# Patient Record
Sex: Female | Born: 1990 | Race: Asian | Hispanic: No | Marital: Married | Smoking: Never smoker
Health system: Southern US, Community
[De-identification: ages and names within clinical notes are randomized; demographics above are authoritative.]

## PROBLEM LIST (undated history)

## (undated) ENCOUNTER — Inpatient Hospital Stay (HOSPITAL_COMMUNITY): Payer: Self-pay

## (undated) DIAGNOSIS — Z789 Other specified health status: Secondary | ICD-10-CM

## (undated) HISTORY — PX: NO PAST SURGERIES: SHX2092

---

## 2008-05-26 ENCOUNTER — Emergency Department (HOSPITAL_COMMUNITY): Admission: EM | Admit: 2008-05-26 | Discharge: 2008-05-27 | Payer: Self-pay | Admitting: Emergency Medicine

## 2009-05-20 ENCOUNTER — Inpatient Hospital Stay (HOSPITAL_COMMUNITY): Admission: AD | Admit: 2009-05-20 | Discharge: 2009-05-21 | Payer: Self-pay | Admitting: Obstetrics & Gynecology

## 2009-05-28 ENCOUNTER — Ambulatory Visit (HOSPITAL_COMMUNITY): Admission: RE | Admit: 2009-05-28 | Discharge: 2009-05-28 | Payer: Self-pay | Admitting: Obstetrics

## 2010-03-08 ENCOUNTER — Encounter: Payer: Self-pay | Admitting: Obstetrics

## 2010-05-06 LAB — URINALYSIS, ROUTINE W REFLEX MICROSCOPIC
Bilirubin Urine: NEGATIVE
Glucose, UA: NEGATIVE mg/dL
Hgb urine dipstick: NEGATIVE
Nitrite: NEGATIVE
Specific Gravity, Urine: 1.015 (ref 1.005–1.030)
pH: 7 (ref 5.0–8.0)

## 2010-09-18 LAB — ABO/RH: RH Type: POSITIVE

## 2010-09-18 LAB — GC/CHLAMYDIA PROBE AMP, GENITAL: Chlamydia: NEGATIVE

## 2010-09-18 LAB — RUBELLA ANTIBODY, IGM: Rubella: IMMUNE

## 2011-01-05 ENCOUNTER — Encounter (HOSPITAL_COMMUNITY): Payer: Self-pay | Admitting: *Deleted

## 2011-01-05 ENCOUNTER — Inpatient Hospital Stay (HOSPITAL_COMMUNITY)
Admission: AD | Admit: 2011-01-05 | Discharge: 2011-01-05 | Disposition: A | Discharge status: Home / Self Care | Payer: Medicaid Other | Source: Ambulatory Visit | Attending: Obstetrics | Admitting: Obstetrics

## 2011-01-05 DIAGNOSIS — N930 Postcoital and contact bleeding: Secondary | ICD-10-CM

## 2011-01-05 DIAGNOSIS — O4690 Antepartum hemorrhage, unspecified, unspecified trimester: Secondary | ICD-10-CM | POA: Insufficient documentation

## 2011-01-05 DIAGNOSIS — Z331 Pregnant state, incidental: Secondary | ICD-10-CM

## 2011-01-05 HISTORY — DX: Other specified health status: Z78.9

## 2011-01-05 LAB — URINE MICROSCOPIC-ADD ON

## 2011-01-05 LAB — URINALYSIS, ROUTINE W REFLEX MICROSCOPIC
Bilirubin Urine: NEGATIVE
Nitrite: NEGATIVE
Urobilinogen, UA: 0.2 mg/dL (ref 0.0–1.0)

## 2011-01-05 LAB — WET PREP, GENITAL: Trich, Wet Prep: NONE SEEN

## 2011-01-05 NOTE — ED Provider Notes (Signed)
Chief Complaint:  Vaginal Bleeding   Latoya Sanchez is  20 y.o. G4P0030.  No LMP recorded. Patient is pregnant..  [redacted]w[redacted]d  She presents complaining of Vaginal Bleeding . Onset is described as sudden and has been present for  4 hours. Reports mild cramping last night after intercourse. States cramping this morning with pink spotting with voiding today. + FM. Denies ctx, BRB, or LOF.  Obstetrical/Gynecological History: OB History    Grav Para Term Preterm Abortions TAB SAB Ect Mult Living   4    3 1 2          Past Medical History: Past Medical History  Diagnosis Date  . No pertinent past medical history     Past Surgical History: Past Surgical History  Procedure Date  . No past surgeries     Family History: No family history on file.  Social History: History  Substance Use Topics  . Smoking status: Never Smoker   . Smokeless tobacco: Not on file  . Alcohol Use: No    Allergies: No Known Allergies  Prescriptions prior to admission  Medication Sig Dispense Refill  . prenatal vitamin w/FE, FA (PRENATAL 1 + 1) 27-1 MG TABS Take 1 tablet by mouth daily.          Review of Systems - Negative except what has been reviewed in the HPI  Physical Exam   Blood pressure 118/67, pulse 117, temperature 98.2 F (36.8 C), temperature source Oral, resp. rate 20, height 5\' 2"  (1.575 m), weight 76.658 kg (169 lb), SpO2 98.00%.  General: General appearance - alert, well appearing, and in no distress, oriented to person, place, and time and normal appearing weight Mental status - alert, oriented to person, place, and time, normal mood, behavior, speech, dress, motor activity, and thought processes, affect appropriate to mood Abdomen - gravid, non-tender FHR: 150, category I, no ctx Focused Gynecological Exam: VULVA: normal appearing vulva with no masses, tenderness or lesions, VAGINA: normal appearing vagina with normal color and discharge, no lesions, CERVIX:  Closed/Stripling/thick  Labs: Recent Results (from the past 24 hour(s))  URINALYSIS, ROUTINE W REFLEX MICROSCOPIC   Collection Time   01/05/11  9:27 AM      Component Value Range   Color, Urine YELLOW  YELLOW    Appearance CLEAR  CLEAR    Specific Gravity, Urine 1.010  1.005 - 1.030    pH 6.0  5.0 - 8.0    Glucose, UA NEGATIVE  NEGATIVE (mg/dL)   Hgb urine dipstick SMALL (*) NEGATIVE    Bilirubin Urine NEGATIVE  NEGATIVE    Ketones, ur NEGATIVE  NEGATIVE (mg/dL)   Protein, ur NEGATIVE  NEGATIVE (mg/dL)   Urobilinogen, UA 0.2  0.0 - 1.0 (mg/dL)   Nitrite NEGATIVE  NEGATIVE    Leukocytes, UA NEGATIVE  NEGATIVE   URINE MICROSCOPIC-ADD ON   Collection Time   01/05/11  9:27 AM      Component Value Range   Squamous Epithelial / LPF RARE  RARE    WBC, UA 0-2  <3 (WBC/hpf)   RBC / HPF 3-6  <3 (RBC/hpf)  WET PREP, GENITAL   Collection Time   01/05/11  9:45 AM      Component Value Range   Yeast, Wet Prep NONE SEEN  NONE SEEN    Trich, Wet Prep NONE SEEN  NONE SEEN    Clue Cells, Wet Prep FEW (*) NONE SEEN    WBC, Wet Prep HPF POC FEW (*) NONE SEEN  MD Consult: d/w Dr. Gaynell Face  Assessment: 30w6 day Gestation Post-Coital Spotting  Plan: Discharge home Bleeding precautions FU with Dr. Gaynell Face as scheduled  Latoya Sanchez E. 01/05/2011,10:01 AM

## 2011-01-05 NOTE — Progress Notes (Signed)
Started spotting about ago.  Cramping woke her at 0500.  First episode of either, no hx of previa or low lying placenta

## 2011-03-07 ENCOUNTER — Encounter (HOSPITAL_COMMUNITY): Payer: Self-pay | Admitting: *Deleted

## 2011-03-07 ENCOUNTER — Inpatient Hospital Stay (HOSPITAL_COMMUNITY)
Admission: AD | Admit: 2011-03-07 | Discharge: 2011-03-12 | DRG: 765 | Disposition: A | Discharge status: Home / Self Care | Payer: Medicaid Other | Source: Ambulatory Visit | Attending: Obstetrics and Gynecology | Admitting: Obstetrics and Gynecology

## 2011-03-07 DIAGNOSIS — Z3400 Encounter for supervision of normal first pregnancy, unspecified trimester: Secondary | ICD-10-CM

## 2011-03-07 DIAGNOSIS — Z98891 History of uterine scar from previous surgery: Secondary | ICD-10-CM | POA: Diagnosis present

## 2011-03-07 DIAGNOSIS — O4100X0 Oligohydramnios, unspecified trimester, not applicable or unspecified: Principal | ICD-10-CM

## 2011-03-07 DIAGNOSIS — IMO0001 Reserved for inherently not codable concepts without codable children: Secondary | ICD-10-CM

## 2011-03-07 DIAGNOSIS — Z3490 Encounter for supervision of normal pregnancy, unspecified, unspecified trimester: Secondary | ICD-10-CM

## 2011-03-07 DIAGNOSIS — D649 Anemia, unspecified: Secondary | ICD-10-CM | POA: Diagnosis present

## 2011-03-07 DIAGNOSIS — O411090 Infection of amniotic sac and membranes, unspecified, unspecified trimester, not applicable or unspecified: Secondary | ICD-10-CM | POA: Diagnosis present

## 2011-03-07 DIAGNOSIS — O324XX0 Maternal care for high head at term, not applicable or unspecified: Secondary | ICD-10-CM | POA: Diagnosis present

## 2011-03-07 DIAGNOSIS — O9903 Anemia complicating the puerperium: Secondary | ICD-10-CM | POA: Diagnosis not present

## 2011-03-07 NOTE — ED Provider Notes (Signed)
History   21 yo G4P0030 at 67 4/7 weeks presented c/o pelvic pain, back pain, increased discharge today.  Reports +FM, denies bleeding.  Cervix has been 3 cm, 70% 2 weeks ago at office visit.  Pregnancy remarkable for: Transfer of care at 35 weeks (from Dr. Gaynell Face) TAB at 15 weeks 2 SABs, one at 14 weeks GBS negative 02/22/11  History of present pregnancy: Patient transferred care at 35 weeks from Dr. Elsie Stain office.  EDC of 03/10/11 was established by [place] 10/08/10.  Anatomy scan was done around 20 weeks, with normal findings per patient report.  Her prenatal course was essentially uncomplicated per her report.  Further signficant events were vaginal bleeding post-coital at 30 weeks. At her last evalution, she was 3 cm, 70% per [facility], CNM--this was 2 weeks ago.  No [place] since anatomy scan.  GBS and cultures negative 02/22/11.  Chief Complaint  Patient presents with  . Labor Eval     OB History    Grav Para Term Preterm Abortions TAB SAB Ect Mult Living   4    3 1 2    0    #1--2005 TAB at 15 weeks #2--2008 SAB at 6 weeks #3--2011 SAB at 14 weeks #4--Current  Past Medical History  Diagnosis Date  . No pertinent past medical history   Hx chlamydia in past per Dr. Elsie Stain records.  Past Surgical History  Procedure Date  . No past surgeries   TAB at 15 weeks 2005  No family history on file. Brother asthma.  MGF leukemia.  MGM thyroid disease.  History  Substance Use Topics  . Smoking status: Never Smoker   . Smokeless tobacco: Not on file  . Alcohol Use: No    Allergies: No Known Allergies  Labs: B+ Negative antibody screen RPR NR HIV negative HbSAG negative Rubella immune Glucola 117 Quad screen normal Cultures negative at NOB and 02/22/11 Hgb 11.6 at [facility] Pap WNL 7/12. GBS negative 02/22/11   Prescriptions prior to admission  Medication Sig Dispense Refill  . prenatal vitamin w/FE, FA (PRENATAL 1 + 1) 27-1 MG TABS Take 1 tablet by mouth daily.           ROS:  Cramping, back pain, increased discharge, +FM.  Physical Exam   Blood pressure 125/82, pulse 107, temperature 98.5 F (36.9 C), resp. rate 18, height 5\' 2"  (1.575 m), weight 182 lb (82.555 kg).  Patient in NAD Chest clear Heart RRR without murmur Abd gravid, NT, 41 week FH Pelvic--no obvious leaking noted.  Cervix very posterior, vtx -1/0 station, 3-4 cm, 80% per RN exam. Ext WNL FHR non-reactive, no decels, negative spontaneous CST UCs 4-5 min, mild.  ED Course  IUP at 39 4/7 weeks Early vs prodromal labor ? Leaking by report  Plan: Check amnisure Check BPP  Nigel Bridgeman, CNM, [place] 03/08/11 12:10am  Addendum: Amnisure negative. FHR without decels, but continues to be non-reactive, negative CST. Will check BPP and EFW, AFI due to size > dates.  Nigel Bridgeman, CNM, [place] 03/08/11 12:30am

## 2011-03-07 NOTE — Progress Notes (Signed)
Pt reports "a lot of pressure in my pelvic area, pressure in my lower back , and cramping in my lower abd". Denies bleeding or ROM. Denies problems with preg. G3P0

## 2011-03-08 ENCOUNTER — Inpatient Hospital Stay (HOSPITAL_COMMUNITY): Payer: Medicaid Other

## 2011-03-08 ENCOUNTER — Encounter (HOSPITAL_COMMUNITY): Payer: Self-pay | Admitting: *Deleted

## 2011-03-08 DIAGNOSIS — IMO0001 Reserved for inherently not codable concepts without codable children: Secondary | ICD-10-CM

## 2011-03-08 DIAGNOSIS — O4100X0 Oligohydramnios, unspecified trimester, not applicable or unspecified: Secondary | ICD-10-CM | POA: Diagnosis present

## 2011-03-08 DIAGNOSIS — Z3490 Encounter for supervision of normal pregnancy, unspecified, unspecified trimester: Secondary | ICD-10-CM

## 2011-03-08 LAB — LACTATE DEHYDROGENASE: LDH: 219 U/L (ref 94–250)

## 2011-03-08 LAB — COMPREHENSIVE METABOLIC PANEL
Albumin: 2.6 g/dL — ABNORMAL LOW (ref 3.5–5.2)
BUN: 10 mg/dL (ref 6–23)
Calcium: 9.3 mg/dL (ref 8.4–10.5)
Chloride: 104 mEq/L (ref 96–112)
Creatinine, Ser: 0.54 mg/dL (ref 0.50–1.10)
Total Bilirubin: 0.2 mg/dL — ABNORMAL LOW (ref 0.3–1.2)

## 2011-03-08 LAB — CBC
MCH: 23.6 pg — ABNORMAL LOW (ref 26.0–34.0)
MCHC: 33 g/dL (ref 30.0–36.0)
MCV: 71.5 fL — ABNORMAL LOW (ref 78.0–100.0)
Platelets: 283 10*3/uL (ref 150–400)
RBC: 4.32 MIL/uL (ref 3.87–5.11)
RDW: 15.1 % (ref 11.5–15.5)

## 2011-03-08 LAB — URIC ACID: Uric Acid, Serum: 6.2 mg/dL (ref 2.4–7.0)

## 2011-03-08 LAB — STREP B DNA PROBE: GBS: NEGATIVE

## 2011-03-08 MED ORDER — PHENYLEPHRINE 40 MCG/ML (10ML) SYRINGE FOR IV PUSH (FOR BLOOD PRESSURE SUPPORT)
80.0000 ug | PREFILLED_SYRINGE | INTRAVENOUS | Status: DC | PRN
  Filled 2011-03-08: qty 5

## 2011-03-08 MED ORDER — BUTORPHANOL TARTRATE 2 MG/ML IJ SOLN: 1.0000 mg | INTRAMUSCULAR | Status: DC | PRN

## 2011-03-08 MED ORDER — LACTATED RINGERS IV SOLN
INTRAVENOUS | Status: DC
  Administered 2011-03-08: 21:00:00 via INTRAVENOUS
  Administered 2011-03-08: 125 mL/h via INTRAVENOUS
  Administered 2011-03-08: 02:00:00 via INTRAVENOUS
  Administered 2011-03-08: 125 mL/h via INTRAVENOUS

## 2011-03-08 MED ORDER — FLEET ENEMA 7-19 GM/118ML RE ENEM: 1.0000 | ENEMA | RECTAL | Status: DC | PRN

## 2011-03-08 MED ORDER — FENTANYL 2.5 MCG/ML BUPIVACAINE 1/10 % EPIDURAL INFUSION (WH - ANES)
INTRAMUSCULAR | Status: DC | PRN
  Administered 2011-03-08: 12 mL/h via EPIDURAL

## 2011-03-08 MED ORDER — IBUPROFEN 600 MG PO TABS: 600.0000 mg | ORAL_TABLET | Freq: Four times a day (QID) | ORAL | Status: DC | PRN

## 2011-03-08 MED ORDER — PHENYLEPHRINE 40 MCG/ML (10ML) SYRINGE FOR IV PUSH (FOR BLOOD PRESSURE SUPPORT): 80.0000 ug | PREFILLED_SYRINGE | INTRAVENOUS | Status: DC | PRN

## 2011-03-08 MED ORDER — CITRIC ACID-SODIUM CITRATE 334-500 MG/5ML PO SOLN
30.0000 mL | ORAL | Status: DC | PRN
  Administered 2011-03-09: 30 mL via ORAL
  Filled 2011-03-08: qty 15

## 2011-03-08 MED ORDER — SODIUM BICARBONATE 8.4 % IV SOLN
INTRAVENOUS | Status: DC | PRN
  Administered 2011-03-08: 4 mL via EPIDURAL

## 2011-03-08 MED ORDER — LACTATED RINGERS IV SOLN
500.0000 mL | Freq: Once | INTRAVENOUS | Status: AC
  Administered 2011-03-08: 500 mL via INTRAVENOUS

## 2011-03-08 MED ORDER — ONDANSETRON HCL 4 MG/2ML IJ SOLN: 4.0000 mg | Freq: Four times a day (QID) | INTRAMUSCULAR | Status: DC | PRN

## 2011-03-08 MED ORDER — FENTANYL 2.5 MCG/ML BUPIVACAINE 1/10 % EPIDURAL INFUSION (WH - ANES)
14.0000 mL/h | INTRAMUSCULAR | Status: DC
  Administered 2011-03-08 (×3): 14 mL/h via EPIDURAL
  Filled 2011-03-08 (×4): qty 60

## 2011-03-08 MED ORDER — ZOLPIDEM TARTRATE 10 MG PO TABS: 10.0000 mg | ORAL_TABLET | Freq: Every evening | ORAL | Status: DC | PRN

## 2011-03-08 MED ORDER — OXYTOCIN BOLUS FROM INFUSION
500.0000 mL | Freq: Once | INTRAVENOUS | Status: DC
  Filled 2011-03-08: qty 500

## 2011-03-08 MED ORDER — OXYTOCIN 20 UNITS IN LACTATED RINGERS INFUSION - SIMPLE
1.0000 m[IU]/min | INTRAVENOUS | Status: DC
  Administered 2011-03-08: 1 m[IU]/min via INTRAVENOUS
  Filled 2011-03-08 (×2): qty 1000

## 2011-03-08 MED ORDER — TERBUTALINE SULFATE 1 MG/ML IJ SOLN: 0.2500 mg | Freq: Once | INTRAMUSCULAR | Status: AC | PRN

## 2011-03-08 MED ORDER — EPHEDRINE 5 MG/ML INJ
10.0000 mg | INTRAVENOUS | Status: DC | PRN
  Filled 2011-03-08: qty 4

## 2011-03-08 MED ORDER — LIDOCAINE HCL (PF) 1 % IJ SOLN
30.0000 mL | INTRAMUSCULAR | Status: DC | PRN
  Filled 2011-03-08: qty 30

## 2011-03-08 MED ORDER — ACETAMINOPHEN 325 MG PO TABS
650.0000 mg | ORAL_TABLET | ORAL | Status: DC | PRN
  Administered 2011-03-08: 650 mg via ORAL
  Filled 2011-03-08: qty 2

## 2011-03-08 MED ORDER — EPHEDRINE 5 MG/ML INJ: 10.0000 mg | INTRAVENOUS | Status: DC | PRN

## 2011-03-08 MED ORDER — DIPHENHYDRAMINE HCL 50 MG/ML IJ SOLN: 12.5000 mg | INTRAMUSCULAR | Status: DC | PRN

## 2011-03-08 MED ORDER — LACTATED RINGERS IV SOLN: 500.0000 mL | INTRAVENOUS | Status: DC | PRN

## 2011-03-08 MED ORDER — SODIUM CHLORIDE 0.9 % IV SOLN
3.0000 g | Freq: Once | INTRAVENOUS | Status: AC
  Administered 2011-03-08: 3 g via INTRAVENOUS
  Filled 2011-03-08: qty 3

## 2011-03-08 MED ORDER — OXYTOCIN 20 UNITS IN LACTATED RINGERS INFUSION - SIMPLE: 125.0000 mL/h | Freq: Once | INTRAVENOUS | Status: DC

## 2011-03-08 MED ORDER — OXYCODONE-ACETAMINOPHEN 5-325 MG PO TABS: 2.0000 | ORAL_TABLET | ORAL | Status: DC | PRN

## 2011-03-08 NOTE — Anesthesia Preprocedure Evaluation (Signed)

## 2011-03-08 NOTE — Progress Notes (Signed)
  Subjective: Now on [employer].  FOB, Latoya Sanchez, at bedside.  Objective: BP 123/78  Pulse 101  Temp(Src) 98 F (36.7 C) (Oral)  Resp 18  Ht 5\' 2"  (1.575 m)  Wt 82.555 kg (182 lb)  BMI 33.29 kg/m2      FHT:  FHR: 140s bpm, variability: moderate,  accelerations:  Present,  decelerations:  Absent.  10x10 accels present. UC:  Occasional now SVE:   Dilation: 3.5 Effacement (%): 80 Station: -1 Exam by::  Manfred Arch, CNM) Very posterior cervix, with head low (-1/0)  Labs: Lab Results  Component Value Date   WBC 12.2* 03/08/2011   HGB 10.2* 03/08/2011   HCT 30.9* 03/08/2011   MCV 71.5* 03/08/2011   PLT 283 03/08/2011    Assessment / Plan: IUP at 39 5/7 weeks Oligohydramnios, neg amnisure BPP 4/8  Start pitocin per low dose protocol. Epidural prn   Latoya Sanchez, Chip Boer 03/08/2011, 2:38 AM

## 2011-03-08 NOTE — Progress Notes (Signed)
Patient ID: Daielle Melcher, female   DOB: 09/03/1990, 21 y.o.   MRN: [id number] .Subjective: C/o increased "pressure" with ctx   Objective: BP 113/65  Pulse 94  Temp(Src) 97.3 F (36.3 C) (Oral)  Resp 20  Ht 5\' 2"  (1.575 m)  Wt 82.555 kg (182 lb)  BMI 33.29 kg/m2  SpO2 100%   FHT:  FHR: 130 bpm, variability: minimal ,  accelerations:  Present,  decelerations:  Present early variables 10x10 accels UC:   regular, every 2-3 minutes SVE:   Dilation: 7.5 Effacement (%): 100 Station: 0 Exam by:: S Lisa Blakeman CNM  MVU's now 180's  Pitocin at 15mu  Assessment / Plan: Induction of labor due to oligo,  progressing well on pitocin    Fetal Wellbeing:  Cat II Pain Control:  Epidural  Update physician PRN  [name] 03/08/2011, 4:50 PM

## 2011-03-08 NOTE — Anesthesia Procedure Notes (Signed)

## 2011-03-08 NOTE — Progress Notes (Signed)
Patient ID: Latoya Sanchez, female   DOB: 08/22/1990, 21 y.o.   MRN: [id number] .Subjective: Starting to breathe with some ctx, but denies needs for meds at this point, plans epidural    Objective: BP 121/70  Pulse 87  Temp(Src) 98.1 F (36.7 C) (Oral)  Resp 19  Ht 5\' 2"  (1.575 m)  Wt 82.555 kg (182 lb)  BMI 33.29 kg/m2   FHT:  FHR: 130 bpm, variability: moderate,  accelerations:  Present,  decelerations:  Present occ mild variable, occ periods of decreased variability and lack of accels,  UC:   irregular, every 2-6 minutes SVE:   Dilation: 4 Effacement (%): 90 Station: 0 Exam by:: S Saddie Sandeen CNM  AROM w scant clear fluid - FSE placed IUPC placed   Assessment / Plan: Induction of labor due to term with favorable cervix and oligo ,  progressing well on pitocin GBS neg    Fetal Wellbeing:  Category II Pain Control:  Labor support without medications  Dr Su Hilt updated [name] 03/08/2011, 12:47 PM

## 2011-03-08 NOTE — Progress Notes (Signed)
Patient ID: Olyvia Gopal, female   DOB: Aug 16, 1990, 21 y.o.   MRN: [id number] .Subjective: Denies pain, occ feeling ctx   Objective: BP 121/70  Pulse 87  Temp(Src) 98.1 F (36.7 C) (Oral)  Resp 19  Ht 5\' 2"  (1.575 m)  Wt 82.555 kg (182 lb)  BMI 33.29 kg/m2   FHT:  FHR: 135 bpm, variability: moderate,  accelerations:  Present,  decelerations:  Present occ mild variable UC:   regular, every 2-3 minutes SVE:   Dilation: 4 Effacement (%): 90 Station: 0 Exam by:: S Ailea Rhatigan CNM  Pitocin at 11mu  Assessment / Plan: Induction of labor due to oligo,  progressing well on pitocin   Fetal Wellbeing:  Category I Pain Control:  Labor support without medications  Update physician PRN  [name] 03/08/2011, 12:51 PM

## 2011-03-08 NOTE — Progress Notes (Signed)
Patient ID: Latoya Sanchez, female   DOB: [birth year], 21 y.o.   MRN: [id number] .Subjective: Comfortable with epidural   Objective: BP 117/74  Pulse 106  Temp(Src) 98.1 F (36.7 C) (Axillary)  Resp 13  Ht 5\' 2"  (1.575 m)  Wt 82.555 kg (182 lb)  BMI 33.29 kg/m2  SpO2 100%   FHT:  FHR: 130 bpm, variability: minimal ,  accelerations:  Abscent,  decelerations:  Present occ mild variable UC:   regular, every 2-3 minutes, IUPC peak is 50-60, MVU'Latoya about 100 SVE:   Dilation: 5 Effacement (%): 100 Station: 0 Exam by:: Latoya Sanchez CNM  Pitocin at 14mu  Assessment / Plan: Induction of labor due to oligo,  progressing well on pitocin Ctx not adequate per IUPC, will continue with pitocin aug Pos scalp stim    Fetal Wellbeing:  Category II Pain Control:  Epidural  Update physician PRN  Latoya Sanchez 03/08/2011, 2:58 PM

## 2011-03-08 NOTE — Progress Notes (Signed)
  Subjective: More aware of contractions, but no uncomfortable.  Objective: BP 110/76  Pulse 95  Temp(Src) 98.2 F (36.8 C) (Oral)  Resp 20  Ht 5\' 2"  (1.575 m)  Wt 82.555 kg (182 lb)  BMI 33.29 kg/m2     FHT: Category 1 UC:   irregular, every 2-6 minutes, mild Pitocin on 4 mu/min  Assessment / Plan: Oligohydramnios BPP 4/8 Reassuring FHR Favorable cervix  Continue pitocin. Dr. Estanislado Pandy updated.   Nigel Bridgeman 03/08/2011, 6:24 AM

## 2011-03-08 NOTE — Progress Notes (Addendum)
Patient ID: Latoya Sanchez, female   DOB: 1990-12-14, 21 y.o.   MRN: [id number] .Subjective: Remains comfortable with epidural   Objective: BP 96/49  Pulse 108  Temp(Src) 97.2 F (36.2 C) (Oral)  Resp 18  Ht 5\' 2"  (1.575 m)  Wt 82.555 kg (182 lb)  BMI 33.29 kg/m2  SpO2 100%   FHT:  FHR: 140 bpm, variability: moderate,  accelerations:  Present,  decelerations:  Present early variables UC:   regular, every 2-3 minutes SVE:   Dilation: 8 Effacement (%): 100 Station: 0 Exam by:: S Ezmeralda Stefanick CNM  Pitocin at 15mu  Assessment / Plan: Protracted active phase GBS neg Maternal tachycardia noted  MVU's adequate  Fetal Wellbeing:  Category I Pain Control:  Epidural Will give PO tylenol now CTO for sx's chorio Will continue pitocin, recheck cx in 2 hours, vertex previously OT, increased caput now ?position   Dr Su Hilt updated   [name] 03/08/2011, 7:10 PM

## 2011-03-08 NOTE — Progress Notes (Signed)
Patient ID: Latoya Sanchez, female   DOB: 09/28/1990, 21 y.o.   MRN: [id number] .Subjective: pushing   Objective: BP 91/69  Pulse 120  Temp(Src) 100.3 F (37.9 C) (Oral)  Resp 18  Ht 5\' 2"  (1.575 m)  Wt 82.555 kg (182 lb)  BMI 33.29 kg/m2  SpO2 100%   FHT:  FHR: 150 bpm, variability: moderate,  accelerations:  Present,  decelerations:  Present variables UC:   regular, every 2-3 minutes SVE:   Dilation: 10 Effacement (%): 100 Station: +1;+2 Exam by:: S. Amanda Steuart, CNM  Pitocin at   Assessment / Plan: 2nd stage GBS neg Suspect chorio - will start Unasyn    Fetal Wellbeing:  Category II Pain Control:  Epidural  Update physician PRN  [name] 03/08/2011, 10:52 PM

## 2011-03-08 NOTE — H&P (Signed)
21 yo G4P0030 at 64 4/7 weeks presented c/o pelvic pain, back pain, increased discharge today.  Reports +FM, denies bleeding.  Cervix has been 3 cm, 70% 2 weeks ago at office visit.  Pregnancy remarkable for: Transfer of care at 35 weeks (from Dr. Gaynell Face) TAB at 15 weeks 2 SABs, one at 14 weeks GBS negative 02/22/11  History of present pregnancy: Patient transferred care at 35 weeks from Dr. Elsie Stain office.  EDC of 03/10/11 was established by [place] 10/08/10.  Anatomy scan was done around 20 weeks, with normal findings per patient report.  Her prenatal course was essentially uncomplicated per her report.  Further signficant events were vaginal bleeding post-coital at 30 weeks. At her last evalution, she was 3 cm, 70% per [facility], CNM--this was 2 weeks ago.  No [place] since anatomy scan.  GBS and cultures negative 02/22/11.  Chief Complaint   Patient presents with   .  Labor Eval    OB History    Grav  Para  Term  Preterm  Abortions  TAB  SAB  Ect  Mult  Living    4     3  1  2     0     #1--2005 TAB at 15 weeks  #2--2008 SAB at 6 weeks  #3--2011 SAB at 14 weeks  #4--Current   Past Medical History   Diagnosis  Date   .  No pertinent past medical history    Hx chlamydia in past per Dr. Elsie Stain records.   Past Surgical History   Procedure  Date   .  No past surgeries    TAB at 15 weeks 2005   Family History:  Brother asthma. MGF leukemia. MGM thyroid disease.   History   Substance Use Topics   .  Smoking status:  Never Smoker   .  Smokeless tobacco:  Not on file   .  Alcohol Use:  No    Allergies: No Known Allergies   Labs:  B+  Negative antibody screen  RPR NR  HIV negative  HbSAG negative  Rubella immune  Glucola 117  Quad screen normal  Cultures negative at NOB and 02/22/11  Hgb 11.6 at NOB  Pap WNL 7/12.  GBS negative 02/22/11    Prescriptions prior to admission   Medication  Sig  Dispense  Refill   .  prenatal vitamin w/FE, FA (PRENATAL 1 + 1) 27-1 MG  TABS  Take 1 tablet by mouth daily.      ROS: Cramping, back pain, increased discharge, +FM.   Physical Exam   Blood pressure 125/82, pulse 107, temperature 98.5 F (36.9 C), resp. rate 18, height 5\' 2"  (1.575 m), weight 182 lb (82.555 kg).  Patient in NAD  Chest clear  Heart RRR without murmur  Abd gravid, NT, 41 week FH  Pelvic--no obvious leaking noted. Cervix very posterior, vtx -1/0 station, 3-4 cm, 80% per RN exam.  Ext WNL  FHR non-reactive, no decels, negative spontaneous CST  UCs 4-5 min, mild.  Results for orders placed during the hospital encounter of 03/07/11 (from the past 24 hour(s))  STREP B DNA PROBE     Status: Normal      Component Value Range   Group B Strep Ag Negative    AMNISURE RUPTURE OF MEMBRANE (ROM)     Status: Normal   Collection Time   03/07/11 11:45 PM      Component Value Range   Amnisure ROM NEGATIVE  [place]:  Oligohydramnios, < 3%ile, with AFI 1.08.         EFW 7+6, 58%ile, 3346 gms         BPP 4/8, with good tone and movement, no credit for fluid or breathing movements         Vtx         Fetal stomach present, but small         Anterior placenta        AC 17%ile  Assessment: IUP at 39 5/7 weeks Oligohydramnios, but negative amnisure BPP 4/8 Favorable cervix  Plan: Admit to Birthing Suite per consult with Dr. Estanislado Pandy Routine CNM orders Plan pitocin per low dose protocol Patient plans epidural Will maintain close observation of FHR status.   Nigel Bridgeman, CNM, [place] 03/08/11 1:40am

## 2011-03-08 NOTE — Progress Notes (Signed)
Patient ID: Latoya Sanchez, female   DOB: [birth year], 21 y.o.   MRN: [id number] .Subjective:  Feeling more pressure with ctx now  Objective: BP 102/42  Pulse 126  Temp(Src) 98.7 F (37.1 C) (Oral)  Resp 18  Ht 5\' 2"  (1.575 m)  Wt 82.555 kg (182 lb)  BMI 33.29 kg/m2  SpO2 100%   FHT:  FHR: 150 bpm, variability: moderate,  accelerations:  Present,  decelerations:  Present early/variables UC:   regular, every 2-3 minutes SVE:   Dilation: Lip/rim Effacement (%): 100 Station: 0 Exam by:: S, Jhoana Upham, CNM  Pitocin at 15mu  Assessment / Plan: IOL -progressing GBS neg Maternal tachycardia Remains afrebile  Will continue to labor/allow labor down Vertex at +1 now  Fetal Wellbeing:  Category I Pain Control:  Epidural  Updated Dr. Evlyn Kanner M 03/08/2011, 9:18 PM

## 2011-03-09 ENCOUNTER — Encounter (HOSPITAL_COMMUNITY)
Admission: AD | Disposition: A | Discharge status: Home / Self Care | Payer: Self-pay | Source: Ambulatory Visit | Attending: Obstetrics and Gynecology

## 2011-03-09 ENCOUNTER — Inpatient Hospital Stay (HOSPITAL_COMMUNITY): Payer: Medicaid Other | Admitting: Anesthesiology

## 2011-03-09 ENCOUNTER — Other Ambulatory Visit: Payer: Self-pay | Admitting: Obstetrics and Gynecology

## 2011-03-09 ENCOUNTER — Encounter (HOSPITAL_COMMUNITY): Payer: Self-pay | Admitting: *Deleted

## 2011-03-09 ENCOUNTER — Encounter (HOSPITAL_COMMUNITY): Payer: Self-pay | Admitting: Anesthesiology

## 2011-03-09 SURGERY — Surgical Case
Anesthesia: Epidural | Site: Abdomen | Wound class: Clean Contaminated

## 2011-03-09 MED ORDER — ONDANSETRON HCL 4 MG PO TABS: 4.0000 mg | ORAL_TABLET | ORAL | Status: DC | PRN

## 2011-03-09 MED ORDER — DIPHENHYDRAMINE HCL 25 MG PO CAPS: 25.0000 mg | ORAL_CAPSULE | ORAL | Status: DC | PRN

## 2011-03-09 MED ORDER — SODIUM CHLORIDE 0.9 % IJ SOLN: 3.0000 mL | INTRAMUSCULAR | Status: DC | PRN

## 2011-03-09 MED ORDER — PHENYLEPHRINE 40 MCG/ML (10ML) SYRINGE FOR IV PUSH (FOR BLOOD PRESSURE SUPPORT)
PREFILLED_SYRINGE | INTRAVENOUS | Status: AC
  Filled 2011-03-09: qty 5

## 2011-03-09 MED ORDER — NALBUPHINE SYRINGE 5 MG/0.5 ML
5.0000 mg | INJECTION | INTRAMUSCULAR | Status: DC | PRN
  Filled 2011-03-09: qty 1

## 2011-03-09 MED ORDER — KETOROLAC TROMETHAMINE 30 MG/ML IJ SOLN: 30.0000 mg | Freq: Four times a day (QID) | INTRAMUSCULAR | Status: AC | PRN

## 2011-03-09 MED ORDER — HYDROMORPHONE HCL PF 1 MG/ML IJ SOLN
INTRAMUSCULAR | Status: AC
  Filled 2011-03-09: qty 1

## 2011-03-09 MED ORDER — OXYTOCIN 10 UNIT/ML IJ SOLN
INTRAMUSCULAR | Status: AC
  Filled 2011-03-09: qty 4

## 2011-03-09 MED ORDER — MEDROXYPROGESTERONE ACETATE 150 MG/ML IM SUSP: 150.0000 mg | INTRAMUSCULAR | Status: DC | PRN

## 2011-03-09 MED ORDER — LANOLIN HYDROUS EX OINT: 1.0000 "application " | TOPICAL_OINTMENT | CUTANEOUS | Status: DC | PRN

## 2011-03-09 MED ORDER — DIBUCAINE 1 % RE OINT: 1.0000 "application " | TOPICAL_OINTMENT | RECTAL | Status: DC | PRN

## 2011-03-09 MED ORDER — IBUPROFEN 600 MG PO TABS
600.0000 mg | ORAL_TABLET | Freq: Four times a day (QID) | ORAL | Status: DC
  Administered 2011-03-09 – 2011-03-12 (×13): 600 mg via ORAL
  Filled 2011-03-09 (×7): qty 1
  Filled 2011-03-09: qty 2
  Filled 2011-03-09 (×4): qty 1

## 2011-03-09 MED ORDER — NALOXONE HCL 0.4 MG/ML IJ SOLN: 0.4000 mg | INTRAMUSCULAR | Status: DC | PRN

## 2011-03-09 MED ORDER — LACTATED RINGERS IV BOLUS (SEPSIS)
250.0000 mL | Freq: Once | INTRAVENOUS | Status: AC
  Administered 2011-03-09: 250 mL via INTRAVENOUS

## 2011-03-09 MED ORDER — SCOPOLAMINE 1 MG/3DAYS TD PT72
MEDICATED_PATCH | TRANSDERMAL | Status: AC
  Filled 2011-03-09: qty 1

## 2011-03-09 MED ORDER — KETOROLAC TROMETHAMINE 60 MG/2ML IM SOLN
INTRAMUSCULAR | Status: AC
  Filled 2011-03-09: qty 2

## 2011-03-09 MED ORDER — MEPERIDINE HCL 25 MG/ML IJ SOLN
INTRAMUSCULAR | Status: DC | PRN
  Administered 2011-03-09: 25 mg via INTRAVENOUS

## 2011-03-09 MED ORDER — OXYTOCIN 20 UNITS IN LACTATED RINGERS INFUSION - SIMPLE
INTRAVENOUS | Status: DC | PRN
  Administered 2011-03-09 (×2): 20 [IU] via INTRAVENOUS

## 2011-03-09 MED ORDER — 0.9 % SODIUM CHLORIDE (POUR BTL) OPTIME
TOPICAL | Status: DC | PRN
  Administered 2011-03-09: 1000 mL

## 2011-03-09 MED ORDER — MORPHINE SULFATE (PF) 0.5 MG/ML IJ SOLN
INTRAMUSCULAR | Status: DC | PRN
  Administered 2011-03-09: 3 mg via EPIDURAL

## 2011-03-09 MED ORDER — BUPIVACAINE HCL (PF) 0.25 % IJ SOLN
INTRAMUSCULAR | Status: AC
  Filled 2011-03-09: qty 30

## 2011-03-09 MED ORDER — SENNOSIDES-DOCUSATE SODIUM 8.6-50 MG PO TABS
2.0000 | ORAL_TABLET | Freq: Every day | ORAL | Status: DC
  Administered 2011-03-09 – 2011-03-11 (×3): 2 via ORAL

## 2011-03-09 MED ORDER — MORPHINE SULFATE 0.5 MG/ML IJ SOLN
INTRAMUSCULAR | Status: AC
  Filled 2011-03-09: qty 10

## 2011-03-09 MED ORDER — MEPERIDINE HCL 25 MG/ML IJ SOLN: 6.2500 mg | INTRAMUSCULAR | Status: DC | PRN

## 2011-03-09 MED ORDER — PRENATAL MULTIVITAMIN CH: 1.0000 | ORAL_TABLET | Freq: Every day | ORAL | Status: DC

## 2011-03-09 MED ORDER — FENTANYL CITRATE 0.05 MG/ML IJ SOLN
INTRAMUSCULAR | Status: AC
  Filled 2011-03-09: qty 2

## 2011-03-09 MED ORDER — MENTHOL 3 MG MT LOZG: 1.0000 | LOZENGE | OROMUCOSAL | Status: DC | PRN

## 2011-03-09 MED ORDER — KETOROLAC TROMETHAMINE 60 MG/2ML IM SOLN
60.0000 mg | Freq: Once | INTRAMUSCULAR | Status: AC | PRN
  Administered 2011-03-09: 60 mg via INTRAMUSCULAR

## 2011-03-09 MED ORDER — DIPHENHYDRAMINE HCL 25 MG PO CAPS: 25.0000 mg | ORAL_CAPSULE | Freq: Four times a day (QID) | ORAL | Status: DC | PRN

## 2011-03-09 MED ORDER — ONDANSETRON HCL 4 MG/2ML IJ SOLN: 4.0000 mg | INTRAMUSCULAR | Status: DC | PRN

## 2011-03-09 MED ORDER — MEPERIDINE HCL 25 MG/ML IJ SOLN
INTRAMUSCULAR | Status: AC
  Filled 2011-03-09: qty 1

## 2011-03-09 MED ORDER — SODIUM BICARBONATE 8.4 % IV SOLN
INTRAVENOUS | Status: AC
  Filled 2011-03-09: qty 50

## 2011-03-09 MED ORDER — ONDANSETRON HCL 4 MG/2ML IJ SOLN: 4.0000 mg | Freq: Three times a day (TID) | INTRAMUSCULAR | Status: DC | PRN

## 2011-03-09 MED ORDER — PHENYLEPHRINE HCL 10 MG/ML IJ SOLN
INTRAMUSCULAR | Status: DC | PRN
  Administered 2011-03-09 (×2): 80 ug via INTRAVENOUS
  Administered 2011-03-09 (×2): 40 ug via INTRAVENOUS
  Administered 2011-03-09: 80 ug via INTRAVENOUS
  Administered 2011-03-09 (×2): 40 ug via INTRAVENOUS
  Administered 2011-03-09 (×2): 80 ug via INTRAVENOUS
  Administered 2011-03-09: 40 ug via INTRAVENOUS
  Administered 2011-03-09: 80 ug via INTRAVENOUS
  Administered 2011-03-09: 40 ug via INTRAVENOUS

## 2011-03-09 MED ORDER — PROMETHAZINE HCL 25 MG/ML IJ SOLN: 6.2500 mg | INTRAMUSCULAR | Status: DC | PRN

## 2011-03-09 MED ORDER — HYDROMORPHONE HCL PF 1 MG/ML IJ SOLN
0.2500 mg | INTRAMUSCULAR | Status: DC | PRN
  Administered 2011-03-09 (×2): 0.5 mg via INTRAVENOUS

## 2011-03-09 MED ORDER — LACTATED RINGERS IV SOLN
INTRAVENOUS | Status: DC | PRN
  Administered 2011-03-08 – 2011-03-09 (×3): via INTRAVENOUS

## 2011-03-09 MED ORDER — LIDOCAINE-EPINEPHRINE (PF) 2 %-1:200000 IJ SOLN
INTRAMUSCULAR | Status: AC
  Filled 2011-03-09: qty 20

## 2011-03-09 MED ORDER — SIMETHICONE 80 MG PO CHEW
80.0000 mg | CHEWABLE_TABLET | Freq: Three times a day (TID) | ORAL | Status: DC
  Administered 2011-03-09 – 2011-03-11 (×10): 80 mg via ORAL

## 2011-03-09 MED ORDER — BUPIVACAINE HCL (PF) 0.25 % IJ SOLN
INTRAMUSCULAR | Status: DC | PRN
  Administered 2011-03-09: 20 mL

## 2011-03-09 MED ORDER — ONDANSETRON HCL 4 MG/2ML IJ SOLN
INTRAMUSCULAR | Status: AC
  Filled 2011-03-09: qty 2

## 2011-03-09 MED ORDER — IBUPROFEN 600 MG PO TABS: 600.0000 mg | ORAL_TABLET | Freq: Four times a day (QID) | ORAL | Status: DC | PRN

## 2011-03-09 MED ORDER — CHLOROPROCAINE HCL 3 % IJ SOLN
INTRAMUSCULAR | Status: AC
  Filled 2011-03-09: qty 20

## 2011-03-09 MED ORDER — WITCH HAZEL-GLYCERIN EX PADS: 1.0000 "application " | MEDICATED_PAD | CUTANEOUS | Status: DC | PRN

## 2011-03-09 MED ORDER — ONDANSETRON HCL 4 MG/2ML IJ SOLN
INTRAMUSCULAR | Status: DC | PRN
  Administered 2011-03-09: 4 mg via INTRAVENOUS

## 2011-03-09 MED ORDER — SCOPOLAMINE 1 MG/3DAYS TD PT72
1.0000 | MEDICATED_PATCH | Freq: Once | TRANSDERMAL | Status: AC
  Administered 2011-03-09: 1.5 mg via TRANSDERMAL

## 2011-03-09 MED ORDER — SIMETHICONE 80 MG PO CHEW: 80.0000 mg | CHEWABLE_TABLET | ORAL | Status: DC | PRN

## 2011-03-09 MED ORDER — KETOROLAC TROMETHAMINE 30 MG/ML IJ SOLN: 15.0000 mg | Freq: Once | INTRAMUSCULAR | Status: DC | PRN

## 2011-03-09 MED ORDER — TETANUS-DIPHTH-ACELL PERTUSSIS 5-2.5-18.5 LF-MCG/0.5 IM SUSP: 0.5000 mL | Freq: Once | INTRAMUSCULAR | Status: DC

## 2011-03-09 MED ORDER — PRENATAL PLUS 27-1 MG PO TABS
1.0000 | ORAL_TABLET | Freq: Every day | ORAL | Status: DC
  Administered 2011-03-10 – 2011-03-12 (×3): 1 via ORAL
  Filled 2011-03-09 (×3): qty 1

## 2011-03-09 MED ORDER — DIPHENHYDRAMINE HCL 50 MG/ML IJ SOLN: 25.0000 mg | INTRAMUSCULAR | Status: DC | PRN

## 2011-03-09 MED ORDER — CHLOROPROCAINE HCL 3 % IJ SOLN
INTRAMUSCULAR | Status: DC | PRN
  Administered 2011-03-09: 7 mL

## 2011-03-09 MED ORDER — OXYTOCIN 20 UNITS IN LACTATED RINGERS INFUSION - SIMPLE: 125.0000 mL/h | INTRAVENOUS | Status: AC

## 2011-03-09 MED ORDER — FENTANYL CITRATE 0.05 MG/ML IJ SOLN
INTRAMUSCULAR | Status: DC | PRN
  Administered 2011-03-09: 100 ug via EPIDURAL

## 2011-03-09 MED ORDER — OXYCODONE-ACETAMINOPHEN 5-325 MG PO TABS
1.0000 | ORAL_TABLET | ORAL | Status: DC | PRN
  Administered 2011-03-09 – 2011-03-10 (×2): 1 via ORAL
  Filled 2011-03-09 (×2): qty 1

## 2011-03-09 MED ORDER — MORPHINE SULFATE (PF) 0.5 MG/ML IJ SOLN
INTRAMUSCULAR | Status: DC | PRN
  Administered 2011-03-09: 2 mg via INTRAVENOUS

## 2011-03-09 MED ORDER — DIPHENHYDRAMINE HCL 50 MG/ML IJ SOLN: 12.5000 mg | INTRAMUSCULAR | Status: DC | PRN

## 2011-03-09 MED ORDER — ZOLPIDEM TARTRATE 5 MG PO TABS: 5.0000 mg | ORAL_TABLET | Freq: Every evening | ORAL | Status: DC | PRN

## 2011-03-09 MED ORDER — LACTATED RINGERS IV SOLN
INTRAVENOUS | Status: DC
  Administered 2011-03-09 (×2): via INTRAVENOUS

## 2011-03-09 MED ORDER — SODIUM CHLORIDE 0.9 % IV SOLN
1.0000 ug/kg/h | INTRAVENOUS | Status: DC | PRN
  Filled 2011-03-09: qty 2.5

## 2011-03-09 MED ORDER — PHENYLEPHRINE 40 MCG/ML (10ML) SYRINGE FOR IV PUSH (FOR BLOOD PRESSURE SUPPORT)
PREFILLED_SYRINGE | INTRAVENOUS | Status: AC
Start: 2011-03-09 — End: 2011-03-09
  Filled 2011-03-09: qty 20

## 2011-03-09 SURGICAL SUPPLY — 37 items
BENZOIN TINCTURE PRP APPL 2/3 (GAUZE/BANDAGES/DRESSINGS) ×2 IMPLANT
CHLORAPREP W/TINT 26ML (MISCELLANEOUS) ×2 IMPLANT
CLOTH BEACON ORANGE TIMEOUT ST (SAFETY) ×2 IMPLANT
CONTAINER PREFILL 10% NBF 15ML (MISCELLANEOUS) IMPLANT
DRSG COVADERM 4X10 (GAUZE/BANDAGES/DRESSINGS) ×2 IMPLANT
ELECT REM PT RETURN 9FT ADLT (ELECTROSURGICAL) ×2
ELECTRODE REM PT RTRN 9FT ADLT (ELECTROSURGICAL) ×1 IMPLANT
EXTRACTOR VACUUM M CUP 4 TUBE (SUCTIONS) IMPLANT
GLOVE BIO SURGEON STRL SZ 6.5 (GLOVE) ×2 IMPLANT
GLOVE BIO SURGEON STRL SZ7.5 (GLOVE) ×4 IMPLANT
GLOVE BIOGEL PI IND STRL 7.5 (GLOVE) ×1 IMPLANT
GLOVE BIOGEL PI INDICATOR 7.5 (GLOVE) ×1
GLOVE SKINSENSE NS SZ7.5 (GLOVE) ×1
GLOVE SKINSENSE NS SZ8.0 LF (GLOVE) ×1
GLOVE SKINSENSE STRL SZ7.5 (GLOVE) ×1 IMPLANT
GLOVE SKINSENSE STRL SZ8.0 LF (GLOVE) ×1 IMPLANT
GOWN PREVENTION PLUS LG XLONG (DISPOSABLE) ×6 IMPLANT
KIT ABG SYR 3ML LUER SLIP (SYRINGE) ×4 IMPLANT
NEEDLE HYPO 22GX1.5 SAFETY (NEEDLE) ×2 IMPLANT
NEEDLE HYPO 25X5/8 SAFETYGLIDE (NEEDLE) ×4 IMPLANT
NS IRRIG 1000ML POUR BTL (IV SOLUTION) ×2 IMPLANT
PACK C SECTION WH (CUSTOM PROCEDURE TRAY) ×2 IMPLANT
RETRACTOR WND ALEXIS 25 LRG (MISCELLANEOUS) ×1 IMPLANT
RTRCTR WOUND ALEXIS 25CM LRG (MISCELLANEOUS) ×2
SLEEVE SCD COMPRESS KNEE MED (MISCELLANEOUS) ×2 IMPLANT
STRIP CLOSURE SKIN 1/2X4 (GAUZE/BANDAGES/DRESSINGS) ×2 IMPLANT
SUT CHROMIC 2 0 CT 1 (SUTURE) ×2 IMPLANT
SUT MNCRL AB 3-0 PS2 27 (SUTURE) ×2 IMPLANT
SUT PLAIN 0 NONE (SUTURE) IMPLANT
SUT PLAIN 2 0 XLH (SUTURE) ×2 IMPLANT
SUT VIC AB 0 CT1 36 (SUTURE) ×2 IMPLANT
SUT VIC AB 0 CTX 36 (SUTURE) ×2
SUT VIC AB 0 CTX36XBRD ANBCTRL (SUTURE) ×2 IMPLANT
SYR CONTROL 10ML LL (SYRINGE) ×2 IMPLANT
TOWEL OR 17X24 6PK STRL BLUE (TOWEL DISPOSABLE) ×4 IMPLANT
TRAY FOLEY CATH 14FR (SET/KITS/TRAYS/PACK) ×2 IMPLANT
WATER STERILE IRR 1000ML POUR (IV SOLUTION) ×2 IMPLANT

## 2011-03-09 NOTE — Progress Notes (Signed)
UR chart review completed.  

## 2011-03-09 NOTE — Anesthesia Postprocedure Evaluation (Signed)
  Anesthesia Post-op Note  Patient: Latoya Sanchez  Procedure(s) Performed:  CESAREAN SECTION - Primary Cesarean Section Delivery Baby  Boy @   [phone], Apgars 8/9  Patient Location: Mother/Baby  Anesthesia Type: Epidural  Level of Consciousness: alert  and oriented  Airway and Oxygen Therapy: Patient Spontanous Breathing  Post-op Pain: mild  Post-op Assessment: Patient's Cardiovascular Status Stable and Respiratory Function Stable  Post-op Vital Signs: stable  Complications: No apparent anesthesia complications

## 2011-03-09 NOTE — Op Note (Addendum)
Cesarean Section Procedure Note  Indications: Pushing for 2 hours with no descent and chorio  Pre-operative Diagnosis: 1. 39 6/7wks 2.Failure to Descend 3.Chorio   Post-operative Diagnosis: Same  Procedure: CESAREAN SECTION  Surgeon: Osborn Coho, MD    Assistants: Sanda Klein, CNM  Anesthesia: Epidural  Anesthesiologist: Sandrea Hughs., MD   Procedure Details  The patient was taken to the operating room secondary to failure to descend after the risks, benefits, complications, treatment options, and expected outcomes were discussed with the patient.  The patient concurred with the proposed plan, giving informed consent which was signed and witnessed. The patient was taken to Operating Room 1, identified as Latoya Sanchez and the procedure verified as C-Section Delivery. A Time Out was held and the above information confirmed.  After induction of anesthesia by obtaining a surgical level via the epidural, the patient was prepped and draped in the usual sterile manner. 0.25% Marcaine was injected at incision site.  A Pfannenstiel skin incision was made and carried down through the subcutaneous tissue to the underlying layer of fascia.  The fascia was incised bilaterally and extended transversely bilaterally with the Mayo scissors. Kocher clamps were placed on the inferior aspect of the fascial incision and the underlying rectus muscle was separated from the fascia. The same was done on the superior aspect of the fascial incision.  The peritoneum was identified, entered bluntly and extended manually. The utero-vesical peritoneal reflection was incised transversely and the bladder flap was bluntly freed from the lower uterine segment. A low transverse uterine incision was made with the scalpel and extended bilaterally with the bandage scissors.  The infant was delivered in vertex position without difficulty.  After the umbilical cord was clamped and cut, the infant was handed to the  awaiting pediatricians.  Cord blood was obtained for evaluation.  The placenta was removed intact and appeared to be within normal limits. The uterus was cleared of all clots and debris. The uterine incision was closed with running interlocking sutures of 0 Vicryl and a second imbricating layer was performed as well.   Bilateral tubes and ovaries appeared to be within normal limits.  Good hemostasis was noted.  Copious irrigation was performed until clear.  The peritoneum was repaired with 2-0 chromic via a running suture.  The fascia was reapproximated with a running suture of 0 Vicryl. The subcutaneous tissue was reapproximated with 4 interrupted sutures of 2-0 plain.  The skin was reapproximated with a subcuticular suture of 3-0 monocryl.  Steristrips were applied with benzoin.  Instrument, sponge, and needle counts were correct prior to abdominal closure and at the conclusion of the case.  The patient was awaiting transfer to the recovery room in good condition.  Findings: Live female infant with Apgars 8 at one minute and 9 at five minutes.  Normal appearing bilateral ovaries and fallopian tubes were noted.  Estimated Blood Loss:          Drains: foley with 100cc         Total IV Fluids:         Specimens to Pathology: Placenta         Complications:  None; patient tolerated the procedure well.         Disposition: PACU - hemodynamically stable.         Condition: stable  Attending Attestation: I performed the procedure.

## 2011-03-09 NOTE — Addendum Note (Signed)
Addendum  created 03/09/11 0750 by Fanny Dance, CRNA   Modules edited:Notes Section

## 2011-03-09 NOTE — Progress Notes (Signed)
Patient ID: Latoya Sanchez, female   DOB: 01-13-1991, 21 y.o.   MRN: [id number] .Subjective: pushing   Objective: BP 129/64  Pulse 120  Temp(Src) 99.2 F (37.3 C) (Oral)  Resp 20  Ht 5\' 2"  (1.575 m)  Wt 82.555 kg (182 lb)  BMI 33.29 kg/m2  SpO2 100%   FHT:  FHR: 150 bpm, variability: moderate,  accelerations:  Present,  decelerations:  Present early/variables UC:   regular, every 2-3 minutes SVE:   Dilation: 10 Effacement (%): 100 Station: +1;+2 Exam by:: S. Danylle Ouk, CNM  Pitocin at 16mu  Assessment / Plan: Arrest of decent GBS neg Suspected chorio - s/p unasyn  Complete x2 hours, no descent Discussed option of waiting/pushing another hour or c-section, pt wishes to proceed with c-section R/B including risk of infection, bleeding, organ damage or anesthesia complications, pt understands these risks and agrees to proceed  Fetal Wellbeing:  Category I Pain Control:  Epidural  Discuss w Dr. Evlyn Kanner M 03/09/2011, 12:04 AM

## 2011-03-09 NOTE — Progress Notes (Signed)
Called Dr Normand Sloop and notified of urine output since 0745. Order for bolus over and call if urine output < 30ml in the next hour.

## 2011-03-09 NOTE — Transfer of Care (Signed)
Immediate Anesthesia Transfer of Care Note  Patient: Latoya Sanchez  Procedure(s) Performed:  CESAREAN SECTION - Primary Cesarean Section Delivery Baby  Boy @   [phone], Apgars 8/9  Patient Location: PACU  Anesthesia Type: Epidural  Level of Consciousness: awake, alert  and oriented  Airway & Oxygen Therapy: Patient Spontanous Breathing  Post-op Assessment: Report given to PACU RN and Post -op Vital signs reviewed and stable  Post vital signs: Reviewed and stable  Complications: No apparent anesthesia complications

## 2011-03-09 NOTE — Anesthesia Postprocedure Evaluation (Signed)
Anesthesia Post Note  Patient: Latoya Sanchez  Procedure(s) Performed:  CESAREAN SECTION - Primary Cesarean Section Delivery Baby  Boy @   [phone], Apgars 8/9  Anesthesia type: Epidural  Patient location: PACU  Post pain: Pain level controlled  Post assessment: Post-op Vital signs reviewed  Last Vitals:  Filed Vitals:   03/09/11 0230  BP: 115/70  Pulse: 116  Temp: 37.1 C  Resp: 20    Post vital signs: Reviewed  Level of consciousness: awake  Complications: No apparent anesthesia complications

## 2011-03-10 ENCOUNTER — Encounter (HOSPITAL_COMMUNITY): Payer: Self-pay | Admitting: Obstetrics and Gynecology

## 2011-03-10 LAB — CBC
Hemoglobin: 7.4 g/dL — ABNORMAL LOW (ref 12.0–15.0)
MCH: 23.3 pg — ABNORMAL LOW (ref 26.0–34.0)
MCHC: 32.5 g/dL (ref 30.0–36.0)
Platelets: 193 10*3/uL (ref 150–400)

## 2011-03-10 MED ORDER — POLYSACCHARIDE IRON 150 MG PO CAPS
150.0000 mg | ORAL_CAPSULE | Freq: Two times a day (BID) | ORAL | Status: DC
  Administered 2011-03-10 – 2011-03-12 (×5): 150 mg via ORAL
  Filled 2011-03-10 (×7): qty 1

## 2011-03-10 NOTE — Addendum Note (Signed)
Addendum  created 03/10/11 1645 by Marrion Coy, CRNA   Modules edited:Notes Section

## 2011-03-10 NOTE — Anesthesia Postprocedure Evaluation (Signed)
  Anesthesia Post-op Note  Patient: Latoya Sanchez  Procedure(s) Performed:  CESAREAN SECTION - Primary Cesarean Section Delivery Baby  Boy @   [phone], Apgars 8/9  Patient Location: PACU  Anesthesia Type: Epidural  Level of Consciousness: awake, alert  and oriented  Airway and Oxygen Therapy: Patient Spontanous Breathing  Post-op Pain: none  Post-op Assessment: Post-op Vital signs reviewed and Patient's Cardiovascular Status Stable  Post-op Vital Signs: Reviewed and stable  Complications: No apparent anesthesia complications

## 2011-03-10 NOTE — Progress Notes (Addendum)
Subjective: Postpartum Day 1: Cesarean Delivery Patient reports incisional pain, tolerating PO, + flatus and no problems voiding.  Pt hasn't been up OOB very much since sx.  RN just in room encouraging ambulation and shower today.  Working on breastfeeding but has also given formula.    Objective: Vital signs in last 24 hours: Temp:  [97.3 F (36.3 C)-99 F (37.2 C)] 97.9 F (36.6 C) (01/23 0531) Pulse Rate:  [85-114] 85  (01/23 0531) Resp:  [18-20] 18  (01/23 0531) BP: (102-107)/(70-74) 102/70 mmHg (01/23 0531) SpO2:  [95 %-100 %] 99 % (01/23 0531)  Physical Exam:  General: alert, cooperative and no distress Abd:  BS+x4 Lochia: appropriate Uterine Fundus: firm; u/-2--appropriate tenderness on exam Incision: no significant drainage; Post-op dsg C/D/I DVT Evaluation: No evidence of DVT seen on physical exam. Negative Homan's sign. 1-2+ BLE edema; pedal as well; pitting   Basename 03/10/11 0535 03/08/11 0155  HGB 7.4* 10.2*  HCT 22.8* 30.9*    Assessment/Plan: Status post Cesarean section. Postoperative course complicated by anemia  Continue current care. Will start bid Fe supplement.  Orthostatic VS today.   Enc'd lactation, and will have LC today.   STEELMAN,CANDICE H 03/10/2011, 11:01 AM   Orthostatic BPs and pulses are within normal limits.  Pt denies dizziness or syncope.  She declines blood transfusion in light of lack of symptoms.

## 2011-03-11 ENCOUNTER — Encounter (HOSPITAL_COMMUNITY): Payer: Self-pay | Admitting: *Deleted

## 2011-03-11 NOTE — Progress Notes (Addendum)
Subjective: Postpartum Day 2: Cesarean Delivery for FTD Patient reports no problems voiding.  Up ad lib without any syncope or dizziness. Minimal pain.    Objective: Vital signs in last 24 hours: Temp:  [98 F (36.7 C)-98.5 F (36.9 C)] 98.5 F (36.9 C) (01/24 0534) Pulse Rate:  [80-99] 81  (01/24 0534) Resp:  [18] 18  (01/24 0534) BP: (105-114)/(71-79) 105/71 mmHg (01/24 0534)  Physical Exam:  General: alert Lochia: appropriate Uterine Fundus: firm Incision: healing well DVT Evaluation: No evidence of DVT seen on physical exam. Negative Homan's sign.   Basename 03/10/11 0535  HGB 7.4*  HCT 22.8*    Assessment/Plan: Status post Cesarean section. Doing well postoperatively. Anemia without hemodynamic instability. On Fe Orthostatics stable Anticipate d/c tomorrow.  R&B of transfusion reviewed--patient declines at present. Continue current care.  Hervey Wedig 03/11/2011, 10:09 AM

## 2011-03-12 DIAGNOSIS — Z98891 History of uterine scar from previous surgery: Secondary | ICD-10-CM | POA: Diagnosis present

## 2011-03-12 DIAGNOSIS — D649 Anemia, unspecified: Secondary | ICD-10-CM | POA: Diagnosis present

## 2011-03-12 MED ORDER — OXYCODONE-ACETAMINOPHEN 5-325 MG PO TABS: 1.0000 | ORAL_TABLET | ORAL | Status: AC | PRN

## 2011-03-12 MED ORDER — IBUPROFEN 600 MG PO TABS: 600.0000 mg | ORAL_TABLET | Freq: Four times a day (QID) | ORAL | Status: AC | PRN

## 2011-03-12 MED ORDER — POLYSACCHARIDE IRON 150 MG PO CAPS: 150.0000 mg | ORAL_CAPSULE | Freq: Every day | ORAL | Status: DC

## 2011-03-12 NOTE — Discharge Summary (Signed)
Obstetric Discharge Summary Reason for Admission: induction of labor due to oligohydramnios, BPP 4/8 (off for oligo, FBM)  Prenatal Procedures: NST and ultrasound Intrapartum Procedures: cesarean: low cervical, transverse Postpartum Procedures: none Complications-Operative and Postpartum: Anemia without hemodynamic instability Hemoglobin  Date Value Range Status  03/10/2011 7.4* 12.0-15.0 (g/dL) Final     HCT  Date Value Range Status  03/10/2011 22.8* 36.0-46.0 (%) Final   Hospital Course: Admitted 03/07/11 for induction due to oligohydramnios and BPP 4/8. Negative GBS. Cervix was favorable, and pitocin was begin.  Patient progressed to fully dilated, but was unable to push the baby out, so primary LTCS was done for FTD and suspected choriomnionitis.  Delivery was performed by Dr. Su Hilt without complication. Patient and baby tolerated the procedure without difficulty. Infant to FTN. Mother and infant then had an uncomplicated postpartum course, with breastfeeding going well and no evidence of infection.  Mom's physical exam was WNL, and she was discharged home in stable condition. Patient plans to follow-up at [facility] for contraception.  She received adequate benefit from po pain medications, and was sent home with prescriptions for Ibuprophen and Percocet.  She had anemia, but no hemodynamic instability--she declined transfusion.     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         Newborn Data: Live born female  Birth Weight:  APGAR: 8, 9  Home with mother.  Nigel Bridgeman 03/12/2011, 9:37 AM

## 2011-04-21 ENCOUNTER — Ambulatory Visit (INDEPENDENT_AMBULATORY_CARE_PROVIDER_SITE_OTHER): Payer: Medicaid Other

## 2011-04-21 DIAGNOSIS — Z3040 Encounter for surveillance of contraceptives, unspecified: Secondary | ICD-10-CM

## 2011-04-21 DIAGNOSIS — D649 Anemia, unspecified: Secondary | ICD-10-CM

## 2011-05-12 ENCOUNTER — Encounter: Payer: Medicaid Other | Admitting: Registered Nurse

## 2011-10-29 ENCOUNTER — Encounter (HOSPITAL_BASED_OUTPATIENT_CLINIC_OR_DEPARTMENT_OTHER): Payer: Self-pay | Admitting: *Deleted

## 2011-10-29 DIAGNOSIS — K648 Other hemorrhoids: Secondary | ICD-10-CM | POA: Insufficient documentation

## 2011-10-29 LAB — URINALYSIS, ROUTINE W REFLEX MICROSCOPIC
Ketones, ur: NEGATIVE mg/dL
Nitrite: NEGATIVE
Protein, ur: NEGATIVE mg/dL
Urobilinogen, UA: 1 mg/dL (ref 0.0–1.0)

## 2011-10-29 LAB — PREGNANCY, URINE: Preg Test, Ur: NEGATIVE

## 2011-10-29 LAB — URINE MICROSCOPIC-ADD ON

## 2011-10-29 NOTE — ED Notes (Addendum)
Rectal bleeding on and off for 5 years. Has been told by different doctors to take probiotics. She has never had relief with them so she does not take them. Lower abdominal discomfort.

## 2011-10-30 ENCOUNTER — Emergency Department (HOSPITAL_BASED_OUTPATIENT_CLINIC_OR_DEPARTMENT_OTHER)
Admission: EM | Admit: 2011-10-30 | Discharge: 2011-10-30 | Disposition: A | Discharge status: Home / Self Care | Payer: Medicaid Other | Attending: Emergency Medicine | Admitting: Emergency Medicine

## 2011-10-30 ENCOUNTER — Emergency Department (HOSPITAL_BASED_OUTPATIENT_CLINIC_OR_DEPARTMENT_OTHER): Payer: Medicaid Other

## 2011-10-30 DIAGNOSIS — K625 Hemorrhage of anus and rectum: Secondary | ICD-10-CM

## 2011-10-30 LAB — CBC WITH DIFFERENTIAL/PLATELET
Basophils Absolute: 0 K/uL (ref 0.0–0.1)
Basophils Relative: 0 % (ref 0–1)
Eosinophils Absolute: 0.1 K/uL (ref 0.0–0.7)
Eosinophils Relative: 1 % (ref 0–5)
HCT: 34.2 % — ABNORMAL LOW (ref 36.0–46.0)
Hemoglobin: 11.6 g/dL — ABNORMAL LOW (ref 12.0–15.0)
Lymphocytes Relative: 31 % (ref 12–46)
Lymphs Abs: 2.2 K/uL (ref 0.7–4.0)
MCH: 23.5 pg — ABNORMAL LOW (ref 26.0–34.0)
MCHC: 33.9 g/dL (ref 30.0–36.0)
MCV: 69.2 fL — ABNORMAL LOW (ref 78.0–100.0)
Monocytes Absolute: 0.9 K/uL (ref 0.1–1.0)
Monocytes Relative: 13 % — ABNORMAL HIGH (ref 3–12)
Neutro Abs: 4 K/uL (ref 1.7–7.7)
Neutrophils Relative %: 55 % (ref 43–77)
Platelets: 281 K/uL (ref 150–400)
RBC: 4.94 MIL/uL (ref 3.87–5.11)
RDW: 16.1 % — ABNORMAL HIGH (ref 11.5–15.5)
WBC: 7.2 K/uL (ref 4.0–10.5)

## 2011-10-30 LAB — BASIC METABOLIC PANEL WITH GFR
BUN: 14 mg/dL (ref 6–23)
CO2: 27 meq/L (ref 19–32)
Calcium: 9.6 mg/dL (ref 8.4–10.5)
Chloride: 104 meq/L (ref 96–112)
Creatinine, Ser: 0.6 mg/dL (ref 0.50–1.10)
GFR calc Af Amer: >90 mL/min
GFR calc non Af Amer: >90 mL/min
Glucose, Bld: 79 mg/dL (ref 70–99)
Potassium: 3.8 meq/L (ref 3.5–5.1)
Sodium: 140 meq/L (ref 135–145)

## 2011-10-30 LAB — OCCULT BLOOD X 1 CARD TO LAB, STOOL: Fecal Occult Bld: NEGATIVE

## 2011-10-30 NOTE — ED Provider Notes (Signed)
History     CSN: [id number]  Arrival date & time 10/29/11  2222   First MD Initiated Contact with Patient 10/30/11 0008      Chief Complaint  Patient presents with  . Rectal Bleeding    (Consider location/radiation/quality/duration/timing/severity/associated sxs/prior treatment) Patient is a 21 y.o. female presenting with hematochezia. The history is provided by the patient. No language interpreter was used.  Rectal Bleeding  The current episode started more than 2 weeks ago. The onset was gradual. The problem occurs occasionally. The problem has been unchanged. The patient is experiencing no pain. The stool is described as soft. There was no prior successful therapy. There was no prior unsuccessful therapy. Pertinent negatives include no anorexia, no fever, no abdominal pain, no diarrhea, no hematemesis, no nausea, no rectal pain, no vomiting, no hematuria, no vaginal bleeding, no vaginal discharge, no chest pain, no headaches, no coughing, no difficulty breathing and no rash. She has been behaving normally. She has been eating and drinking normally. Urine output has been normal. Her past medical history does not include inflammatory bowel disease, recent change in diet or a recent illness. There were no sick contacts. She has received no recent medical care.  hAS had for 4 years and not told PMD.  Told by GYN to take miralax but problem continues.  Past Medical History  Diagnosis Date  . No pertinent past medical history     Past Surgical History  Procedure Date  . No past surgeries   . Cesarean section 03/09/2011    Procedure: CESAREAN SECTION;  Surgeon: Purcell Nails, MD;  Location: [facility];  Service: Gynecology;  Laterality: N/A;  Primary Cesarean Section Delivery Baby  Boy @   [phone], Apgars 8/9    Family History  Problem Relation Age of Onset  . Asthma Brother   . Cancer Maternal Grandfather     History  Substance Use Topics  . Smoking status: Never Smoker   . Smokeless  tobacco: Never Used  . Alcohol Use: No    OB History    Grav Para Term Preterm Abortions TAB SAB Ect Mult Living   5 1 1  3 1 2   1       Review of Systems  Constitutional: Negative for fever.  Respiratory: Negative for cough.   Cardiovascular: Negative for chest pain.  Gastrointestinal: Positive for hematochezia. Negative for nausea, vomiting, abdominal pain, diarrhea, rectal pain, anorexia and hematemesis.  Genitourinary: Negative for hematuria, vaginal bleeding and vaginal discharge.  Skin: Negative for rash.  Neurological: Negative for headaches.  All other systems reviewed and are negative.    Allergies  Review of patient's allergies indicates no known allergies.  Home Medications   Current Outpatient Rx  Name Route Sig Dispense Refill  . POLYSACCHARIDE IRON 150 MG PO CAPS Oral Take 1 capsule (150 mg total) by mouth daily. 30 each 2  . PRENATAL PLUS 27-1 MG PO TABS Oral Take 1 tablet by mouth daily.       BP 119/90  Pulse 90  Temp 98.2 F (36.8 C) (Oral)  Resp 20  SpO2 100%  LMP 10/05/2011  Physical Exam  Constitutional: She is oriented to person, place, and time. She appears well-developed and well-nourished. No distress.  HENT:  Head: Normocephalic and atraumatic.  Mouth/Throat: Oropharynx is clear and moist.  Eyes: Conjunctivae normal are normal. Pupils are equal, round, and reactive to light.  Neck: Normal range of motion. Neck supple.  Cardiovascular: Normal rate and regular rhythm.  Pulmonary/Chest: Effort normal and breath sounds normal. She has no wheezes. She has no rales.  Abdominal: Soft. Bowel sounds are normal. She exhibits no distension and no mass. There is no tenderness. There is no rebound and no guarding.  Genitourinary: Guaiac negative stool.       Internal hemorrhoid  Musculoskeletal: Normal range of motion.  Neurological: She is alert and oriented to person, place, and time.  Skin: Skin is warm and dry.  Psychiatric: She has a normal  mood and affect.    ED Course  Procedures (including critical care time)  Labs Reviewed  URINALYSIS, ROUTINE W REFLEX MICROSCOPIC - Abnormal; Notable for the following:    APPearance CLOUDY (*)     Leukocytes, UA SMALL (*)     All other components within normal limits  URINE MICROSCOPIC-ADD ON - Abnormal; Notable for the following:    Squamous Epithelial / LPF FEW (*)     Bacteria, UA FEW (*)     All other components within normal limits  CBC WITH DIFFERENTIAL - Abnormal; Notable for the following:    Hemoglobin 11.6 (*)     HCT 34.2 (*)     MCV 69.2 (*)     MCH 23.5 (*)     RDW 16.1 (*)     Monocytes Relative 13 (*)     All other components within normal limits  PREGNANCY, URINE  BASIC METABOLIC PANEL  OCCULT BLOOD X 1 CARD TO LAB, STOOL   Dg Abd Acute W/chest  10/30/2011  *RADIOLOGY REPORT*  Clinical Data: Lower abdominal pain  ACUTE ABDOMEN SERIES (ABDOMEN 2 VIEW & CHEST 1 VIEW)  Comparison: None.  Findings: Lungs are clear.  Cardiomediastinal contours within normal range.  No free intraperitoneal air. The bowel gas pattern is non- obstructive. Organ outlines are normal where seen. No acute or aggressive osseous abnormality identified.  IMPRESSION: Nonobstructive bowel gas pattern.   Original Report Authenticated By: Waneta Martins, M.D.      No diagnosis found.    MDM  Exam vitals reassuring.  No inidcation for CT scan.  Patient need to follow up with PMD and GI for ongoing care.  Return for worsening symptoms.  Verbalizes understanding and agrees to follow up      Crayton Savarese Smitty Cords, MD 10/30/11 [phone]

## 2012-07-27 ENCOUNTER — Encounter (HOSPITAL_BASED_OUTPATIENT_CLINIC_OR_DEPARTMENT_OTHER): Payer: Self-pay | Admitting: Emergency Medicine

## 2012-07-27 ENCOUNTER — Emergency Department (HOSPITAL_BASED_OUTPATIENT_CLINIC_OR_DEPARTMENT_OTHER): Payer: Medicaid Other

## 2012-07-27 ENCOUNTER — Emergency Department (HOSPITAL_BASED_OUTPATIENT_CLINIC_OR_DEPARTMENT_OTHER)
Admission: EM | Admit: 2012-07-27 | Discharge: 2012-07-27 | Disposition: A | Discharge status: Home / Self Care | Payer: Medicaid Other | Attending: Emergency Medicine | Admitting: Emergency Medicine

## 2012-07-27 DIAGNOSIS — R059 Cough, unspecified: Secondary | ICD-10-CM | POA: Insufficient documentation

## 2012-07-27 DIAGNOSIS — J069 Acute upper respiratory infection, unspecified: Secondary | ICD-10-CM | POA: Insufficient documentation

## 2012-07-27 DIAGNOSIS — J3489 Other specified disorders of nose and nasal sinuses: Secondary | ICD-10-CM | POA: Insufficient documentation

## 2012-07-27 DIAGNOSIS — R0982 Postnasal drip: Secondary | ICD-10-CM | POA: Insufficient documentation

## 2012-07-27 DIAGNOSIS — R112 Nausea with vomiting, unspecified: Secondary | ICD-10-CM | POA: Insufficient documentation

## 2012-07-27 DIAGNOSIS — R05 Cough: Secondary | ICD-10-CM

## 2012-07-27 DIAGNOSIS — R6883 Chills (without fever): Secondary | ICD-10-CM | POA: Insufficient documentation

## 2012-07-27 DIAGNOSIS — R51 Headache: Secondary | ICD-10-CM | POA: Insufficient documentation

## 2012-07-27 MED ORDER — HYDROCODONE-HOMATROPINE 5-1.5 MG/5ML PO SYRP: 2.5000 mL | ORAL_SOLUTION | Freq: Four times a day (QID) | ORAL | Status: DC | PRN

## 2012-07-27 MED ORDER — SALINE NASAL SPRAY 0.65 % NA SOLN: 1.0000 | NASAL | Status: DC | PRN

## 2012-07-27 NOTE — ED Provider Notes (Signed)
History     CSN: [id number]  Arrival date & time 07/27/12  1911   First MD Initiated Contact with Patient 07/27/12 1917      Chief Complaint  Patient presents with  . Headache  . Facial Pain    (Consider location/radiation/quality/duration/timing/severity/associated sxs/prior treatment) HPI  Patient is a 22 yo F presenting to the ED for four days of sinus pressure rated 4/10, nasal congestion, and productive cough w/ green sputum. Pt has associated chills, nausea, and two episodes of non-bloody non-bilious vomiting. Tylenol alleviated sinus pressure. No aggravating factors.   Past Medical History  Diagnosis Date  . No pertinent past medical history     Past Surgical History  Procedure Laterality Date  . No past surgeries    . Cesarean section  03/09/2011    Procedure: CESAREAN SECTION;  Surgeon: Purcell Nails, MD;  Location: [facility];  Service: Gynecology;  Laterality: N/A;  Primary Cesarean Section Delivery Baby  Boy @   [phone], Apgars 8/9    Family History  Problem Relation Age of Onset  . Asthma Brother   . Cancer Maternal Grandfather     History  Substance Use Topics  . Smoking status: Never Smoker   . Smokeless tobacco: Never Used  . Alcohol Use: No    OB History   Grav Para Term Preterm Abortions TAB SAB Ect Mult Living   5 1 1  3 1 2   1       Review of Systems  Constitutional: Positive for chills.  HENT: Positive for congestion, postnasal drip and sinus pressure.   Respiratory: Positive for cough.   Cardiovascular: Negative for chest pain.  Gastrointestinal: Positive for nausea and vomiting.    Allergies  Review of patient's allergies indicates no known allergies.  Home Medications   Current Outpatient Rx  Name  Route  Sig  Dispense  Refill  . acetaminophen (TYLENOL) 325 MG tablet   Oral   Take 650 mg by mouth every 6 (six) hours as needed for pain.         . GuaiFENesin (MUCINEX PO)   Oral   Take by mouth.         Marland Kitchen  HYDROcodone-homatropine (HYCODAN) 5-1.5 MG/5ML syrup   Oral   Take 2.5 mLs by mouth every 6 (six) hours as needed for cough.   120 mL   0   . polysaccharide iron (NIFEREX) 150 MG CAPS capsule   Oral   Take 1 capsule (150 mg total) by mouth daily.   30 each   2   . prenatal vitamin w/FE, FA (PRENATAL 1 + 1) 27-1 MG TABS   Oral   Take 1 tablet by mouth daily.          . sodium chloride (OCEAN NASAL SPRAY) 0.65 % nasal spray   Nasal   Place 1 spray into the nose as needed for congestion.   30 mL   12     BP 115/85  Temp(Src) 98.4 F (36.9 C) (Oral)  Resp 18  Ht 5\' 2"  (1.575 m)  Wt 137 lb (62.143 kg)  BMI 25.05 kg/m2  SpO2 99%  Physical Exam  Constitutional: She is oriented to person, place, and time. She appears well-developed and well-nourished. No distress.  HENT:  Head: Normocephalic and atraumatic.  Eyes: Conjunctivae are normal.  Neck: Neck supple.  Neurological: She is alert and oriented to person, place, and time.  Skin: Skin is warm and dry. She is not diaphoretic.  Psychiatric: She has a normal mood and affect.    ED Course  Procedures (including critical care time)  Labs Reviewed - No data to display Dg Chest 2 View  07/27/2012   *RADIOLOGY REPORT*  Clinical Data:  Cough and congestion.  CHEST - 2 VIEW  Comparison: None  Findings: The heart size and mediastinal contours are within normal limits.  Both lungs are clear.  The visualized skeletal structures are unremarkable.  IMPRESSION: No active disease.   Original Report Authenticated By: Irish Lack, M.D.     1. Cough   2. Sinus headache   3. URI (upper respiratory infection)       MDM  Pt CXR negative for acute infiltrate. Patients symptoms are consistent with URI, likely viral etiology. Discussed that antibiotics are not indicated for viral infections. Pt will be discharged with symptomatic treatment.  Verbalizes understanding and is agreeable with plan. Pt is hemodynamically stable & in NAD  prior to dc. Patient is stable at time of discharge         Jeannetta Ellis, PA-C 07/27/12 2228

## 2012-07-27 NOTE — ED Notes (Signed)
Pt c/o headaches and sinus pain x 4 days.

## 2012-07-28 NOTE — ED Provider Notes (Signed)
History/physical exam/procedure(Sanchez) were performed by non-physician practitioner and as supervising physician I was immediately available for consultation/collaboration. I have reviewed all notes and am in agreement with care and plan.   Latoya Sanchez Latoya Harvie, MD 07/28/12 1518 

## 2013-12-14 ENCOUNTER — Encounter (HOSPITAL_BASED_OUTPATIENT_CLINIC_OR_DEPARTMENT_OTHER): Payer: Self-pay | Admitting: Emergency Medicine

## 2013-12-14 ENCOUNTER — Emergency Department (HOSPITAL_BASED_OUTPATIENT_CLINIC_OR_DEPARTMENT_OTHER)
Admission: EM | Admit: 2013-12-14 | Discharge: 2013-12-14 | Disposition: A | Discharge status: Home / Self Care | Payer: Medicaid Other | Attending: Emergency Medicine | Admitting: Emergency Medicine

## 2013-12-14 DIAGNOSIS — J069 Acute upper respiratory infection, unspecified: Secondary | ICD-10-CM | POA: Insufficient documentation

## 2013-12-14 DIAGNOSIS — Z79899 Other long term (current) drug therapy: Secondary | ICD-10-CM | POA: Diagnosis not present

## 2013-12-14 DIAGNOSIS — R05 Cough: Secondary | ICD-10-CM | POA: Diagnosis present

## 2013-12-14 DIAGNOSIS — M791 Myalgia: Secondary | ICD-10-CM | POA: Diagnosis not present

## 2013-12-14 DIAGNOSIS — R110 Nausea: Secondary | ICD-10-CM | POA: Diagnosis not present

## 2013-12-14 DIAGNOSIS — R630 Anorexia: Secondary | ICD-10-CM | POA: Diagnosis not present

## 2013-12-14 MED ORDER — ONDANSETRON HCL 4 MG PO TABS: 4.0000 mg | ORAL_TABLET | Freq: Four times a day (QID) | ORAL | Status: DC

## 2013-12-14 MED ORDER — IBUPROFEN 800 MG PO TABS: 800.0000 mg | ORAL_TABLET | Freq: Three times a day (TID) | ORAL | Status: DC

## 2013-12-14 NOTE — ED Provider Notes (Signed)
Medical screening examination/treatment/procedure(s) were performed by non-physician practitioner and as supervising physician I was immediately available for consultation/collaboration.   EKG Interpretation None        Joya Gaskinsonald W Geeta Dworkin, MD 12/14/13 1410

## 2013-12-14 NOTE — Discharge Instructions (Signed)
Upper Respiratory Infection, Adult An upper respiratory infection (URI) is also sometimes known as the common cold. The upper respiratory tract includes the nose, sinuses, throat, trachea, and bronchi. Bronchi are the airways leading to the lungs. Most people improve within 1 week, but symptoms can last up to 2 weeks. A residual cough may last even longer.  CAUSES Many different viruses can infect the tissues lining the upper respiratory tract. The tissues become irritated and inflamed and often become very moist. Mucus production is also common. A cold is contagious. You can easily spread the virus to others by oral contact. This includes kissing, sharing a glass, coughing, or sneezing. Touching your mouth or nose and then touching a surface, which is then touched by another person, can also spread the virus. SYMPTOMS  Symptoms typically develop 1 to 3 days after you come in contact with a cold virus. Symptoms vary from person to person. They may include:  Runny nose.  Sneezing.  Nasal congestion.  Sinus irritation.  Sore throat.  Loss of voice (laryngitis).  Cough.  Fatigue.  Muscle aches.  Loss of appetite.  Headache.  Low-grade fever. DIAGNOSIS  You might diagnose your own cold based on familiar symptoms, since most people get a cold 2 to 3 times a year. Your caregiver can confirm this based on your exam. Most importantly, your caregiver can check that your symptoms are not due to another disease such as strep throat, sinusitis, pneumonia, asthma, or epiglottitis. Blood tests, throat tests, and X-rays are not necessary to diagnose a common cold, but they may sometimes be helpful in excluding other more serious diseases. Your caregiver will decide if any further tests are required. RISKS AND COMPLICATIONS  You may be at risk for a more severe case of the common cold if you smoke cigarettes, have chronic heart disease (such as heart failure) or lung disease (such as asthma), or if  you have a weakened immune system. The very young and very old are also at risk for more serious infections. Bacterial sinusitis, middle ear infections, and bacterial pneumonia can complicate the common cold. The common cold can worsen asthma and chronic obstructive pulmonary disease (COPD). Sometimes, these complications can require emergency medical care and may be life-threatening. PREVENTION  The best way to protect against getting a cold is to practice good hygiene. Avoid oral or hand contact with people with cold symptoms. Wash your hands often if contact occurs. There is no clear evidence that vitamin C, vitamin E, echinacea, or exercise reduces the chance of developing a cold. However, it is always recommended to get plenty of rest and practice good nutrition. TREATMENT  Treatment is directed at relieving symptoms. There is no cure. Antibiotics are not effective, because the infection is caused by a virus, not by bacteria. Treatment may include:  Increased fluid intake. Sports drinks offer valuable electrolytes, sugars, and fluids.  Breathing heated mist or steam (vaporizer or shower).  Eating chicken soup or other clear broths, and maintaining good nutrition.  Getting plenty of rest.  Using gargles or lozenges for comfort.  Controlling fevers with ibuprofen or acetaminophen as directed by your caregiver.  Increasing usage of your inhaler if you have asthma. Zinc gel and zinc lozenges, taken in the first 24 hours of the common cold, can shorten the duration and lessen the severity of symptoms. Pain medicines may help with fever, muscle aches, and throat pain. A variety of non-prescription medicines are available to treat congestion and runny nose. Your caregiver   can make recommendations and may suggest nasal or lung inhalers for other symptoms.  HOME CARE INSTRUCTIONS   Only take over-the-counter or prescription medicines for pain, discomfort, or fever as directed by your  caregiver.  Use a warm mist humidifier or inhale steam from a shower to increase air moisture. This may keep secretions moist and make it easier to breathe.  Drink enough water and fluids to keep your urine clear or pale yellow.  Rest as needed.  Return to work when your temperature has returned to normal or as your caregiver advises. You may need to stay home longer to avoid infecting others. You can also use a face mask and careful hand washing to prevent spread of the virus. SEEK MEDICAL CARE IF:   After the first few days, you feel you are getting worse rather than better.  You need your caregiver's advice about medicines to control symptoms.  You develop chills, worsening shortness of breath, or brown or red sputum. These may be signs of pneumonia.  You develop yellow or brown nasal discharge or pain in the face, especially when you bend forward. These may be signs of sinusitis.  You develop a fever, swollen neck glands, pain with swallowing, or white areas in the back of your throat. These may be signs of strep throat. SEEK IMMEDIATE MEDICAL CARE IF:   You have a fever.  You develop severe or persistent headache, ear pain, sinus pain, or chest pain.  You develop wheezing, a prolonged cough, cough up blood, or have a change in your usual mucus (if you have chronic lung disease).  You develop sore muscles or a stiff neck. Document Released: 07/28/2000 Document Revised: 04/26/2011 Document Reviewed: 05/09/2013 [employer] Patient Information 2015 [employer]. This information is not intended to replace advice given to you by your health care provider. Make sure you discuss any questions you have with your health care provider.  

## 2013-12-14 NOTE — ED Notes (Signed)
Pt reports cough, fever, chills and sore throat x 3 days. Reports taking Tylenol last night. Sts body aches as well.

## 2013-12-14 NOTE — ED Provider Notes (Signed)
CSN: [id number]     Arrival date & time 12/14/13  1158 History   First MD Initiated Contact with Patient 12/14/13 1206     Chief Complaint  Patient presents with  . Cough     (Consider location/radiation/quality/duration/timing/severity/associated sxs/prior Treatment) Patient is a 23 y.o. female presenting with cough. The history is provided by the patient. No language interpreter was used.  Cough Cough characteristics:  Productive Severity:  Mild Duration:  1 day Chronicity:  New Smoker: no   Context: sick contacts   Associated symptoms: chills, fever, myalgias and sore throat   Associated symptoms: no rash and no shortness of breath   Associated symptoms comment:  She complains of body aches, chills, fever, cough, sore throat and congestion. She has nausea with any attempt at eating, otherwise, no nausea. No abdominal pain, rash, headache. She reports her son has similar symptoms.    Past Medical History  Diagnosis Date  . No pertinent past medical history    Past Surgical History  Procedure Laterality Date  . No past surgeries    . Cesarean section  03/09/2011    Procedure: CESAREAN SECTION;  Surgeon: Purcell NailsAngela Y Roberts, MD;  Location: [facility];  Service: Gynecology;  Laterality: N/A;  Primary Cesarean Section Delivery Baby  Boy @   [phone], Apgars 8/9   Family History  Problem Relation Age of Onset  . Asthma Brother   . Cancer Maternal Grandfather    History  Substance Use Topics  . Smoking status: Never Smoker   . Smokeless tobacco: Never Used  . Alcohol Use: No   OB History   Grav Para Term Preterm Abortions TAB SAB Ect Mult Living   5 1 1  3 1 2   1      Review of Systems  Constitutional: Positive for fever, chills and appetite change.  HENT: Positive for congestion and sore throat. Negative for trouble swallowing.   Respiratory: Positive for cough. Negative for shortness of breath.   Cardiovascular: Negative.   Gastrointestinal: Positive for nausea. Negative for  abdominal pain.       See HPI.  Musculoskeletal: Positive for myalgias.  Skin: Negative.  Negative for rash.  Neurological: Negative.       Allergies  Review of patient's allergies indicates no known allergies.  Home Medications   Prior to Admission medications   Medication Sig Start Date End Date Taking? Authorizing Provider  acetaminophen (TYLENOL) 325 MG tablet Take 650 mg by mouth every 6 (six) hours as needed for pain.    Historical Provider, MD  GuaiFENesin (MUCINEX PO) Take by mouth.    Historical Provider, MD  HYDROcodone-homatropine (HYCODAN) 5-1.5 MG/5ML syrup Take 2.5 mLs by mouth every 6 (six) hours as needed for cough. 07/27/12   Jennifer L Piepenbrink, PA-C  polysaccharide iron (NIFEREX) 150 MG CAPS capsule Take 1 capsule (150 mg total) by mouth daily. 03/12/11   Nigel BridgemanVicki Latham, CNM  prenatal vitamin w/FE, FA (PRENATAL 1 + 1) 27-1 MG TABS Take 1 tablet by mouth daily.     Historical Provider, MD  sodium chloride (OCEAN NASAL SPRAY) 0.65 % nasal spray Place 1 spray into the nose as needed for congestion. 07/27/12   Jennifer L Piepenbrink, PA-C   BP 121/89  Pulse 90  Temp(Src) 98.7 F (37.1 C) (Oral)  Resp 18  Ht 5\' 2"  (1.575 m)  Wt 137 lb (62.143 kg)  BMI 25.05 kg/m2  SpO2 99%  LMP 12/14/2013 Physical Exam  Constitutional: She is oriented to person,  place, and time. She appears well-developed and well-nourished.  HENT:  Head: Normocephalic.  Nose: Mucosal edema present.  Mouth/Throat: Mucous membranes are normal. Posterior oropharyngeal erythema present. No posterior oropharyngeal edema.  Eyes: Conjunctivae are normal.  Neck: Normal range of motion. Neck supple.  Cardiovascular: Normal rate and regular rhythm.   Pulmonary/Chest: Effort normal and breath sounds normal. She has no wheezes. She has no rales.  Abdominal: Soft. Bowel sounds are normal. There is no tenderness. There is no rebound and no guarding.  Musculoskeletal: Normal range of motion.  Neurological:  She is alert and oriented to person, place, and time.  Skin: Skin is warm and dry. No rash noted.  Psychiatric: She has a normal mood and affect.    ED Course  Procedures (including critical care time) Labs Review Labs Reviewed - No data to display  Imaging Review No results found.   EKG Interpretation None      MDM   Final diagnoses:  None    1. URI  She is well appearing, with essentially normal exam and sick contacts - supporting viral presentation. Supportive management.     Arnoldo HookerShari A Nijae Doyel, PA-C 12/14/13 1226

## 2013-12-17 ENCOUNTER — Encounter (HOSPITAL_BASED_OUTPATIENT_CLINIC_OR_DEPARTMENT_OTHER): Payer: Self-pay | Admitting: Emergency Medicine

## 2014-05-19 ENCOUNTER — Encounter (HOSPITAL_COMMUNITY): Payer: Self-pay | Admitting: Emergency Medicine

## 2014-05-19 ENCOUNTER — Emergency Department (HOSPITAL_COMMUNITY)
Admission: EM | Admit: 2014-05-19 | Discharge: 2014-05-20 | Disposition: A | Discharge status: Home / Self Care | Payer: Medicaid Other | Attending: Emergency Medicine | Admitting: Emergency Medicine

## 2014-05-19 DIAGNOSIS — K625 Hemorrhage of anus and rectum: Secondary | ICD-10-CM | POA: Diagnosis not present

## 2014-05-19 DIAGNOSIS — Z3202 Encounter for pregnancy test, result negative: Secondary | ICD-10-CM | POA: Diagnosis not present

## 2014-05-19 DIAGNOSIS — Z9889 Other specified postprocedural states: Secondary | ICD-10-CM | POA: Diagnosis not present

## 2014-05-19 DIAGNOSIS — K644 Residual hemorrhoidal skin tags: Secondary | ICD-10-CM | POA: Diagnosis not present

## 2014-05-19 LAB — CBC WITH DIFFERENTIAL/PLATELET
Basophils Absolute: 0 10*3/uL (ref 0.0–0.1)
Basophils Relative: 0 % (ref 0–1)
EOS PCT: 1 % (ref 0–5)
Eosinophils Absolute: 0.1 10*3/uL (ref 0.0–0.7)
HCT: 38 % (ref 36.0–46.0)
Hemoglobin: 12.5 g/dL (ref 12.0–15.0)
LYMPHS PCT: 27 % (ref 12–46)
Lymphs Abs: 2.4 10*3/uL (ref 0.7–4.0)
MCH: 24.9 pg — AB (ref 26.0–34.0)
MCHC: 32.9 g/dL (ref 30.0–36.0)
MCV: 75.5 fL — ABNORMAL LOW (ref 78.0–100.0)
MONO ABS: 0.7 10*3/uL (ref 0.1–1.0)
MONOS PCT: 8 % (ref 3–12)
NEUTROS ABS: 5.7 10*3/uL (ref 1.7–7.7)
NEUTROS PCT: 64 % (ref 43–77)
PLATELETS: 239 10*3/uL (ref 150–400)
RBC: 5.03 MIL/uL (ref 3.87–5.11)
RDW: 13.9 % (ref 11.5–15.5)
WBC: 8.9 10*3/uL (ref 4.0–10.5)

## 2014-05-19 NOTE — ED Notes (Signed)
Pt states she's been having lower abdominal cramping x3 days with small amount of constant rectal bleeding. States she vomited once today.

## 2014-05-20 LAB — POC URINE PREG, ED: PREG TEST UR: NEGATIVE

## 2014-05-20 LAB — COMPREHENSIVE METABOLIC PANEL
ALT: 14 U/L (ref 0–35)
ANION GAP: 7 (ref 5–15)
AST: 20 U/L (ref 0–37)
Albumin: 3.9 g/dL (ref 3.5–5.2)
Alkaline Phosphatase: 48 U/L (ref 39–117)
BILIRUBIN TOTAL: 0.4 mg/dL (ref 0.3–1.2)
BUN: 12 mg/dL (ref 6–23)
CALCIUM: 9.1 mg/dL (ref 8.4–10.5)
CO2: 26 mmol/L (ref 19–32)
Chloride: 108 mmol/L (ref 96–112)
Creatinine, Ser: 0.6 mg/dL (ref 0.50–1.10)
GFR calc Af Amer: >90 mL/min (ref >90)
GFR calc non Af Amer: >90 mL/min (ref >90)
GLUCOSE: 92 mg/dL (ref 70–99)
POTASSIUM: 3.8 mmol/L (ref 3.5–5.1)
SODIUM: 141 mmol/L (ref 135–145)
TOTAL PROTEIN: 7.3 g/dL (ref 6.0–8.3)

## 2014-05-20 LAB — URINALYSIS, ROUTINE W REFLEX MICROSCOPIC
Bilirubin Urine: NEGATIVE
Glucose, UA: NEGATIVE mg/dL
Hgb urine dipstick: NEGATIVE
KETONES UR: NEGATIVE mg/dL
Leukocytes, UA: NEGATIVE
Nitrite: NEGATIVE
Protein, ur: NEGATIVE mg/dL
Specific Gravity, Urine: 1.026 (ref 1.005–1.030)
Urobilinogen, UA: 1 mg/dL (ref 0.0–1.0)
pH: 6.5 (ref 5.0–8.0)

## 2014-05-20 MED ORDER — DOCUSATE SODIUM 100 MG PO CAPS: 100.0000 mg | ORAL_CAPSULE | Freq: Two times a day (BID) | ORAL | Status: DC

## 2014-05-20 NOTE — ED Notes (Signed)

## 2014-05-20 NOTE — ED Provider Notes (Signed)
CSN: [id number]     Arrival date & time 05/19/14  2235 History   First MD Initiated Contact with Patient 05/20/14 0012     No chief complaint on file.     HPI Patient reports lower abdominal cramping over the past 3 days with some rectal bleeding.  She states she's had intermittent rectal bleeding over the past 2 years.  She did vomit once today.  She denies fevers and chills.  No dysuria or urinary frequency.  She denies 5 from bleeding and reports this is from her rectum only.  She states sometimes the bleeding is with bowel movements and sometimes is without.  She states she's seen clots and in the past but there were no clots today.  No history of inflammatory bowel disease   Past Medical History  Diagnosis Date  . No pertinent past medical history    Past Surgical History  Procedure Laterality Date  . No past surgeries    . Cesarean section  03/09/2011    Procedure: CESAREAN SECTION;  Surgeon: Purcell Nails, MD;  Location: [facility];  Service: Gynecology;  Laterality: N/A;  Primary Cesarean Section Delivery Baby  Boy @   [phone], Apgars 8/9   Family History  Problem Relation Age of Onset  . Asthma Brother   . Cancer Maternal Grandfather    History  Substance Use Topics  . Smoking status: Never Smoker   . Smokeless tobacco: Never Used  . Alcohol Use: No   OB History    Gravida Para Term Preterm AB TAB SAB Ectopic Multiple Living   Review of Systems  All other systems reviewed and are negative.     Allergies  Review of patient's allergies indicates no known allergies.  Home Medications   Prior to Admission medications   Medication Sig Start Date End Date Taking? Authorizing Provider  etonogestrel (NEXPLANON) 68 MG IMPL implant 1 each by Subdermal route continuous.   Yes Historical Provider, MD  acetaminophen (TYLENOL) 325 MG tablet Take 650 mg by mouth every 6 (six) hours as needed for pain.    Historical Provider, MD  docusate sodium (COLACE)  100 MG capsule Take 1 capsule (100 mg total) by mouth every 12 (twelve) hours. 05/20/14   Azalia Bilis, MD  GuaiFENesin (MUCINEX PO) Take by mouth.    Historical Provider, MD  HYDROcodone-homatropine (HYCODAN) 5-1.5 MG/5ML syrup Take 2.5 mLs by mouth every 6 (six) hours as needed for cough. Patient not taking: Reported on 05/20/2014 07/27/12   Francee Piccolo, PA-C  ibuprofen (ADVIL,MOTRIN) 800 MG tablet Take 1 tablet (800 mg total) by mouth 3 (three) times daily. Patient not taking: Reported on 05/20/2014 12/14/13   Elpidio Anis, PA-C  ondansetron (ZOFRAN) 4 MG tablet Take 1 tablet (4 mg total) by mouth every 6 (six) hours. Patient not taking: Reported on 05/20/2014 12/14/13   Elpidio Anis, PA-C  polysaccharide iron (NIFEREX) 150 MG CAPS capsule Take 1 capsule (150 mg total) by mouth daily. Patient not taking: Reported on 05/20/2014 03/12/11   Nigel Bridgeman, CNM  prenatal vitamin w/FE, FA (PRENATAL 1 + 1) 27-1 MG TABS Take 1 tablet by mouth daily.     Historical Provider, MD  sodium chloride (OCEAN NASAL SPRAY) 0.65 % nasal spray Place 1 spray into the nose as needed for congestion. Patient not taking: Reported on 05/20/2014 07/27/12   Francee Piccolo, PA-C   BP 128/79 mmHg  Pulse 99  Temp(Src) 98 F (36.7 C) (Oral)  Resp 18  SpO2 100%  LMP 04/22/2014 Physical Exam  Constitutional: She is oriented to person, place, and time. She appears well-developed and well-nourished. No distress.  HENT:  Head: Normocephalic and atraumatic.  Eyes: EOM are normal.  Neck: Normal range of motion.  Cardiovascular: Normal rate, regular rhythm and normal heart sounds.   Pulmonary/Chest: Effort normal and breath sounds normal.  Abdominal: Soft. She exhibits no distension. There is no tenderness.  Genitourinary:  Small nonbleeding external hemorrhoid on rectal exam.  No obvious rectal masses, no internal hemorrhoids palpated.  Stools brown.  Small amount of gross red blood noted on gloved finger.  Chaperone  present during examination  Musculoskeletal: Normal range of motion.  Neurological: She is alert and oriented to person, place, and time.  Skin: Skin is warm and dry.  Psychiatric: She has a normal mood and affect. Judgment normal.  Nursing note and vitals reviewed.   ED Course  Procedures (including critical care time) Labs Review Labs Reviewed  CBC WITH DIFFERENTIAL/PLATELET - Abnormal; Notable for the following:    MCV 75.5 (*)    MCH 24.9 (*)    All other components within normal limits  URINALYSIS, ROUTINE W REFLEX MICROSCOPIC - Abnormal; Notable for the following:    APPearance CLOUDY (*)    All other components within normal limits  COMPREHENSIVE METABOLIC PANEL  POC URINE PREG, ED    Imaging Review No results found.   EKG Interpretation None      MDM   Final diagnoses:  Rectal bleeding    Suspect small internal hemorrhoid.  No active bleeding at this time.  Vital signs are normal.  Hemoglobin is normal.  Home with GI follow-up.  Abdominal exam is benign.  No indication for CT imaging.    Azalia BilisKevin Kinzly Pierrelouis, MD 05/20/14 [id number]

## 2014-05-20 NOTE — ED Notes (Signed)
MD at bedside. 

## 2014-05-20 NOTE — ED Notes (Signed)
Rectal exam performed by EDP with this nurse as witness Patient tolerated well Patient up to bathroom to provide urine specimen

## 2014-05-20 NOTE — Discharge Instructions (Signed)

## 2014-08-15 ENCOUNTER — Emergency Department (HOSPITAL_BASED_OUTPATIENT_CLINIC_OR_DEPARTMENT_OTHER)
Admission: EM | Admit: 2014-08-15 | Discharge: 2014-08-15 | Disposition: A | Discharge status: Home / Self Care | Payer: Medicaid Other | Attending: Emergency Medicine | Admitting: Emergency Medicine

## 2014-08-15 ENCOUNTER — Encounter (HOSPITAL_BASED_OUTPATIENT_CLINIC_OR_DEPARTMENT_OTHER): Payer: Self-pay | Admitting: *Deleted

## 2014-08-15 ENCOUNTER — Emergency Department (HOSPITAL_BASED_OUTPATIENT_CLINIC_OR_DEPARTMENT_OTHER): Payer: Medicaid Other

## 2014-08-15 DIAGNOSIS — S63502A Unspecified sprain of left wrist, initial encounter: Secondary | ICD-10-CM | POA: Insufficient documentation

## 2014-08-15 DIAGNOSIS — Y9289 Other specified places as the place of occurrence of the external cause: Secondary | ICD-10-CM | POA: Diagnosis not present

## 2014-08-15 DIAGNOSIS — Y998 Other external cause status: Secondary | ICD-10-CM | POA: Insufficient documentation

## 2014-08-15 DIAGNOSIS — S0083XA Contusion of other part of head, initial encounter: Secondary | ICD-10-CM

## 2014-08-15 DIAGNOSIS — Z79899 Other long term (current) drug therapy: Secondary | ICD-10-CM | POA: Diagnosis not present

## 2014-08-15 DIAGNOSIS — Y9389 Activity, other specified: Secondary | ICD-10-CM | POA: Insufficient documentation

## 2014-08-15 DIAGNOSIS — S0990XA Unspecified injury of head, initial encounter: Secondary | ICD-10-CM | POA: Diagnosis present

## 2014-08-15 MED ORDER — NAPROXEN 375 MG PO TABS: 375.0000 mg | ORAL_TABLET | Freq: Two times a day (BID) | ORAL | Status: DC
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
## 2014-08-15 NOTE — Discharge Instructions (Signed)
Facial or Scalp Contusion °A facial or scalp contusion is a deep bruise on the face or head. Injuries to the face and head generally cause a lot of swelling, especially around the eyes. Contusions are the result of an injury that caused bleeding under the skin. The contusion may turn blue, purple, or yellow. Minor injuries will give you a painless contusion, but more severe contusions may stay painful and swollen for a few weeks.  °CAUSES  °A facial or scalp contusion is caused by a blunt injury or trauma to the face or head area.  °SIGNS AND SYMPTOMS  °· Swelling of the injured area.   °· Discoloration of the injured area.   °· Tenderness, soreness, or pain in the injured area.   °DIAGNOSIS  °The diagnosis can be made by taking a medical history and doing a physical exam. An X-ray exam, CT scan, or MRI may be needed to determine if there are any associated injuries, such as broken bones (fractures). °TREATMENT  °Often, the best treatment for a facial or scalp contusion is applying cold compresses to the injured area. Over-the-counter medicines may also be recommended for pain control.  °HOME CARE INSTRUCTIONS  °· Only take over-the-counter or prescription medicines as directed by your health care provider.   °· Apply ice to the injured area.   °· Put ice in a plastic bag.   °· Place a towel between your skin and the bag.   °· Leave the ice on for 20 minutes, 2-3 times a day.   °SEEK MEDICAL CARE IF: °· You have bite problems.   °· You have pain with chewing.   °· You are concerned about facial defects. °SEEK IMMEDIATE MEDICAL CARE IF: °· You have severe pain or a headache that is not relieved by medicine.   °· You have unusual sleepiness, confusion, or personality changes.   °· You throw up (vomit).   °· You have a persistent nosebleed.   °· You have double vision or blurred vision.   °· You have fluid drainage from your nose or ear.   °· You have difficulty walking or using your arms or legs.   °MAKE SURE YOU:   °· Understand these instructions. °· Will watch your condition. °· Will get help right away if you are not doing well or get worse. °Document Released: 03/11/2004 Document Revised: 11/22/2012 Document Reviewed: 09/14/2012 °[employer]® Patient Information ©2015 [employer]. This information is not intended to replace advice given to you by your health care provider. Make sure you discuss any questions you have with your health care provider. ° °Wrist Pain °Wrist injuries are frequent in adults and children. A sprain is an injury to the ligaments that hold your bones together. A strain is an injury to muscle or muscle cord-like structures (tendons) from stretching or pulling. Generally, when wrists are moderately tender to touch following a fall or injury, a break in the bone (fracture) may be present. Most wrist sprains or strains are better in 3 to 5 days, but complete healing may take several weeks. °HOME CARE INSTRUCTIONS  °· Put ice on the injured area. °¨ Put ice in a plastic bag. °¨ Place a towel between your skin and the bag. °¨ Leave the ice on for 15-20 minutes, 3-4 times a day, for the first 2 days, or as directed by your health care provider. °· Keep your arm raised above the level of your heart whenever possible to reduce swelling and pain. °· Rest the injured area for at least 48 hours or as directed by your health care provider. °· If   a splint or elastic bandage has been applied, use it for as Berhe as directed by your health care provider or until seen by a health care provider for a follow-up exam. °· Only take over-the-counter or prescription medicines for pain, discomfort, or fever as directed by your health care provider. °· Keep all follow-up appointments. You may need to follow up with a specialist or have follow-up X-rays. Improvement in pain level is not a guarantee that you did not fracture a bone in your wrist. The only way to determine whether or not you have a broken bone is by X-ray. °SEEK  IMMEDIATE MEDICAL CARE IF:  °· Your fingers are swollen, very red, white, or cold and blue. °· Your fingers are numb or tingling. °· You have increasing pain. °· You have difficulty moving your fingers. °MAKE SURE YOU:  °· Understand these instructions. °· Will watch your condition. °· Will get help right away if you are not doing well or get worse. °Document Released: 11/11/2004 Document Revised: 02/06/2013 Document Reviewed: 03/25/2010 °[employer]® Patient Information ©2015 [employer]. This information is not intended to replace advice given to you by your health care provider. Make sure you discuss any questions you have with your health care provider. ° °
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
## 2014-11-25 ENCOUNTER — Emergency Department (HOSPITAL_BASED_OUTPATIENT_CLINIC_OR_DEPARTMENT_OTHER)
Admission: EM | Admit: 2014-11-25 | Discharge: 2014-11-25 | Disposition: A | Discharge status: Home / Self Care | Payer: Medicaid Other | Attending: Emergency Medicine | Admitting: Emergency Medicine

## 2014-11-25 ENCOUNTER — Encounter (HOSPITAL_BASED_OUTPATIENT_CLINIC_OR_DEPARTMENT_OTHER): Payer: Self-pay

## 2014-11-25 DIAGNOSIS — Z3202 Encounter for pregnancy test, result negative: Secondary | ICD-10-CM | POA: Insufficient documentation

## 2014-11-25 DIAGNOSIS — R350 Frequency of micturition: Secondary | ICD-10-CM | POA: Diagnosis present

## 2014-11-25 DIAGNOSIS — N730 Acute parametritis and pelvic cellulitis: Secondary | ICD-10-CM

## 2014-11-25 DIAGNOSIS — Z791 Long term (current) use of non-steroidal anti-inflammatories (NSAID): Secondary | ICD-10-CM | POA: Diagnosis not present

## 2014-11-25 DIAGNOSIS — N739 Female pelvic inflammatory disease, unspecified: Secondary | ICD-10-CM | POA: Insufficient documentation

## 2014-11-25 LAB — URINALYSIS, ROUTINE W REFLEX MICROSCOPIC
BILIRUBIN URINE: NEGATIVE
Glucose, UA: NEGATIVE mg/dL
HGB URINE DIPSTICK: NEGATIVE
KETONES UR: NEGATIVE mg/dL
Leukocytes, UA: NEGATIVE
Nitrite: NEGATIVE
PROTEIN: NEGATIVE mg/dL
Specific Gravity, Urine: 1.015 (ref 1.005–1.030)
UROBILINOGEN UA: 0.2 mg/dL (ref 0.0–1.0)
pH: 7 (ref 5.0–8.0)

## 2014-11-25 LAB — WET PREP, GENITAL
Clue Cells Wet Prep HPF POC: NONE SEEN
Trich, Wet Prep: NONE SEEN
Yeast Wet Prep HPF POC: NONE SEEN

## 2014-11-25 LAB — PREGNANCY, URINE: Preg Test, Ur: NEGATIVE

## 2014-11-25 MED ORDER — ONDANSETRON 4 MG PO TBDP
4.0000 mg | ORAL_TABLET | Freq: Once | ORAL | Status: AC
  Administered 2014-11-25: 4 mg via ORAL
  Filled 2014-11-25: qty 1

## 2014-11-25 MED ORDER — ONDANSETRON 4 MG PO TBDP: 4.0000 mg | ORAL_TABLET | Freq: Three times a day (TID) | ORAL | Status: DC | PRN

## 2014-11-25 MED ORDER — DOXYCYCLINE HYCLATE 100 MG PO TABS
100.0000 mg | ORAL_TABLET | Freq: Once | ORAL | Status: AC
  Administered 2014-11-25: 100 mg via ORAL
  Filled 2014-11-25: qty 1

## 2014-11-25 MED ORDER — DOXYCYCLINE HYCLATE 100 MG PO CAPS: 100.0000 mg | ORAL_CAPSULE | Freq: Two times a day (BID) | ORAL | Status: DC

## 2014-11-25 MED ORDER — FLUCONAZOLE 150 MG PO TABS: 150.0000 mg | ORAL_TABLET | Freq: Once | ORAL | Status: DC

## 2014-11-25 MED ORDER — IBUPROFEN 800 MG PO TABS: 800.0000 mg | ORAL_TABLET | Freq: Three times a day (TID) | ORAL | Status: DC | PRN

## 2014-11-25 MED ORDER — CEFTRIAXONE SODIUM 250 MG IJ SOLR
250.0000 mg | Freq: Once | INTRAMUSCULAR | Status: AC
  Administered 2014-11-25: 250 mg via INTRAMUSCULAR
  Filled 2014-11-25: qty 250

## 2014-11-25 NOTE — ED Notes (Signed)
Pt reports 2 day history of frequent urination, dysuria, pelvic pain and sensation that she cannot fully empty her bladder.

## 2014-11-25 NOTE — ED Notes (Signed)
Pelvic cart at bedside. 
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
## 2014-11-25 NOTE — Discharge Instructions (Signed)
Pelvic Inflammatory Disease °Pelvic inflammatory disease (PID) refers to an infection in some or all of the female organs. The infection can be in the uterus, ovaries, fallopian tubes, or the surrounding tissues in the pelvis. PID can cause abdominal or pelvic pain that comes on suddenly (acute pelvic pain). PID is a serious infection because it can lead to lasting (chronic) pelvic pain or the inability to have children (infertility). °CAUSES °This condition is most often caused by an infection that is spread during sexual contact. However, the infection can also be caused by the normal bacteria that are found in the vaginal tissues if these bacteria travel upward into the reproductive organs. PID can also occur following: °· The birth of a baby. °· A miscarriage. °· An abortion. °· Major pelvic surgery. °· The use of an intrauterine device (IUD). °· A sexual assault. °RISK FACTORS °This condition is more likely to develop in women who: °· Are younger than 25 years of age. °· Are sexually active at a young age. °· Use nonbarrier contraception. °· Have multiple sexual partners. °· Have sex with someone who has symptoms of an STD (sexually transmitted disease). °· Use oral contraception. °At times, certain behaviors can also increase the possibility of getting PID, such as: °· Using a vaginal douche. °· Having an IUD in place. °SYMPTOMS °Symptoms of this condition include: °· Abdominal or pelvic pain. °· Fever. °· Chills. °· Abnormal vaginal discharge. °· Abnormal uterine bleeding. °· Unusual pain shortly after the end of a menstrual period. °· Painful urination. °· Pain with sexual intercourse. °· Nausea and vomiting. °DIAGNOSIS °To diagnose this condition, your health care provider will do a physical exam and take your medical history. A pelvic exam typically reveals great tenderness in the uterus and the surrounding pelvic tissues. You may also have tests, such as: °· Lab tests, including a pregnancy test, blood  tests, and urine test. °· Culture tests of the vagina and cervix to check for an STD. °· Ultrasound. °· A laparoscopic procedure to look inside the pelvis. °· Examining vaginal secretions under a microscope. °TREATMENT °Treatment for this condition may involve one or more approaches. °· Antibiotic medicines may be prescribed to be taken by mouth. °· Sexual partners may need to be treated if the infection is caused by an STD. °· For more severe cases, hospitalization may be needed to give antibiotics directly into a vein through an IV tube. °· Surgery may be needed if other treatments do not help, but this is rare. °It may take weeks until you are completely well. If you are diagnosed with PID, you should also be checked for human immunodeficiency virus (HIV). Your health care provider may test you for infection again 3 months after treatment. You should not have unprotected sex. °HOME CARE INSTRUCTIONS °· Take over-the-counter and prescription medicines only as told by your health care provider. °· If you were prescribed an antibiotic medicine, take it as told by your health care provider. Do not stop taking the antibiotic even if you start to feel better. °· Do not have sexual intercourse until treatment is completed or as told by your health care provider. If PID is confirmed, your recent sexual partners will need treatment, especially if you had unprotected sex. °· Keep all follow-up visits as told by your health care provider. This is important. °SEEK MEDICAL CARE IF: °· You have increased or abnormal vaginal discharge. °· Your pain does not improve. °· You vomit. °· You have a fever. °· You   cannot tolerate your medicines. °· Your partner has an STD. °· You have pain when you urinate. °SEEK IMMEDIATE MEDICAL CARE IF: °· You have increased abdominal or pelvic pain. °· You have chills. °· Your symptoms are not better in 72 hours even with treatment. °  °This information is not intended to replace advice given to  you by your health care provider. Make sure you discuss any questions you have with your health care provider. °  °Document Released: 02/01/2005 Document Revised: 10/23/2014 Document Reviewed: 03/11/2014 °Elsevier Interactive Patient Education ©2016 [employer] ° °[facility] °[email or web address] °[address] °[place], [place] °[phone]  ° ° °[facility] °[email or web address] °[address] °[place], [place] °[phone]  ° ° °[facility] °[address] °[place], [place] °[phone]  ° °[facility] °[email or web address] °[address] °[place], [place] °[phone]  ° °[facility] °[email or web address] °[address] °[place], [place] °[phone]  ° °[facility] °[email or web address] °[address] °, [place] °[phone] ° ° °

## 2014-11-25 NOTE — ED Notes (Signed)
Pt verbalizes understanding of d/c instructions and denies any further needs at this time. 

## 2014-11-26 LAB — URINE CULTURE: CULTURE: NO GROWTH

## 2014-11-26 LAB — GC/CHLAMYDIA PROBE AMP (~~LOC~~) NOT AT ARMC
Chlamydia: NEGATIVE
Neisseria Gonorrhea: NEGATIVE

## 2015-10-01 ENCOUNTER — Emergency Department (HOSPITAL_BASED_OUTPATIENT_CLINIC_OR_DEPARTMENT_OTHER)
Admission: EM | Admit: 2015-10-01 | Discharge: 2015-10-02 | Disposition: A | Discharge status: Home / Self Care | Payer: Medicaid Other | Attending: Emergency Medicine | Admitting: Emergency Medicine

## 2015-10-01 ENCOUNTER — Encounter (HOSPITAL_BASED_OUTPATIENT_CLINIC_OR_DEPARTMENT_OTHER): Payer: Self-pay

## 2015-10-01 DIAGNOSIS — N3000 Acute cystitis without hematuria: Secondary | ICD-10-CM | POA: Diagnosis not present

## 2015-10-01 DIAGNOSIS — R1012 Left upper quadrant pain: Secondary | ICD-10-CM | POA: Diagnosis present

## 2015-10-01 LAB — COMPREHENSIVE METABOLIC PANEL
ALBUMIN: 4.1 g/dL (ref 3.5–5.0)
ALT: 13 U/L — ABNORMAL LOW (ref 14–54)
AST: 18 U/L (ref 15–41)
Alkaline Phosphatase: 51 U/L (ref 38–126)
Anion gap: 9 (ref 5–15)
BUN: 10 mg/dL (ref 6–20)
CALCIUM: 9.1 mg/dL (ref 8.9–10.3)
CO2: 26 mmol/L (ref 22–32)
Chloride: 104 mmol/L (ref 101–111)
Creatinine, Ser: 0.58 mg/dL (ref 0.44–1.00)
GFR calc non Af Amer: >60 mL/min (ref >60)
Glucose, Bld: 96 mg/dL (ref 65–99)
POTASSIUM: 3.7 mmol/L (ref 3.5–5.1)
Sodium: 139 mmol/L (ref 135–145)
Total Bilirubin: 0.7 mg/dL (ref 0.3–1.2)
Total Protein: 7.7 g/dL (ref 6.5–8.1)

## 2015-10-01 LAB — URINE MICROSCOPIC-ADD ON

## 2015-10-01 LAB — CBC WITH DIFFERENTIAL/PLATELET
Basophils Absolute: 0 10*3/uL (ref 0.0–0.1)
Basophils Relative: 0 %
EOS ABS: 0 10*3/uL (ref 0.0–0.7)
EOS PCT: 0 %
HEMATOCRIT: 37.8 % (ref 36.0–46.0)
Hemoglobin: 12.9 g/dL (ref 12.0–15.0)
LYMPHS ABS: 1.7 10*3/uL (ref 0.7–4.0)
Lymphocytes Relative: 12 %
MCH: 25.3 pg — AB (ref 26.0–34.0)
MCHC: 34.1 g/dL (ref 30.0–36.0)
MCV: 74.1 fL — ABNORMAL LOW (ref 78.0–100.0)
MONO ABS: 1.1 10*3/uL — AB (ref 0.1–1.0)
Monocytes Relative: 8 %
Neutro Abs: 11.2 10*3/uL — ABNORMAL HIGH (ref 1.7–7.7)
Neutrophils Relative %: 80 %
Platelets: 262 10*3/uL (ref 150–400)
RBC: 5.1 MIL/uL (ref 3.87–5.11)
RDW: 14 % (ref 11.5–15.5)
WBC: 14 10*3/uL — ABNORMAL HIGH (ref 4.0–10.5)

## 2015-10-01 LAB — URINALYSIS, ROUTINE W REFLEX MICROSCOPIC
Bilirubin Urine: NEGATIVE
Glucose, UA: NEGATIVE mg/dL
KETONES UR: NEGATIVE mg/dL
NITRITE: NEGATIVE
Protein, ur: 100 mg/dL — AB
SPECIFIC GRAVITY, URINE: 1.011 (ref 1.005–1.030)
pH: 6 (ref 5.0–8.0)

## 2015-10-01 LAB — LIPASE, BLOOD: LIPASE: 30 U/L (ref 11–51)

## 2015-10-01 LAB — PREGNANCY, URINE: Preg Test, Ur: NEGATIVE

## 2015-10-01 MED ORDER — MORPHINE SULFATE (PF) 4 MG/ML IV SOLN
4.0000 mg | Freq: Once | INTRAVENOUS | Status: AC
Start: 2015-10-01 — End: 2015-10-01
  Administered 2015-10-01: 4 mg via INTRAVENOUS
  Filled 2015-10-01: qty 1

## 2015-10-01 MED ORDER — ONDANSETRON HCL 4 MG/2ML IJ SOLN
4.0000 mg | Freq: Once | INTRAMUSCULAR | Status: AC
  Administered 2015-10-01: 4 mg via INTRAVENOUS
  Filled 2015-10-01: qty 2

## 2015-10-01 NOTE — ED Triage Notes (Signed)
Generalized abd pain, mid/lower back pain x today-denies v/d-NAD-steady gait

## 2015-10-01 NOTE — ED Provider Notes (Signed)
[facility] Provider Note   CSN: [id number] Arrival date & time: 10/01/15  2036  By signing my name below, I, [name], attest that this documentation has been prepared under the direction and in the presence of Orion Vandervort, PA-C. Electronically Signed: Angelene GiovanniEmmanuella Mensah, ED Scribe. 10/01/15. 10:59 PM.   History   Chief Complaint Chief Complaint  Patient presents with  . Abdominal Pain    HPI Comments: Latoya Sanchez is a 25 y.o. female who presents to the Emergency Department complaining of ongoing 8/10 burning, cramping abdominal pain, worse on LUQ and LLQ that radiates toward her lower back onset today at 1 pm. She notes that her pain feels as though she is having contractions. She reports associated vomiting after meals and urinary frequency. No alleviating factors noted. Pt states that she has tried 2 Advils with some relief. Pt's LKMP was 09/09/15. She denies any dysuria, fever, chills, diarrhea, abnormal vaginal discharge or bleeding, or any other complaints.    The history is provided by the patient. No language interpreter was used.    Past Medical History:  Diagnosis Date  . No pertinent past medical history     Patient Active Problem List   Diagnosis Date Noted  . Anemia 03/12/2011  . Status post primary low transverse cesarean section 03/12/2011    Past Surgical History:  Procedure Laterality Date  . CESAREAN SECTION  03/09/2011   Procedure: CESAREAN SECTION;  Surgeon: Purcell NailsAngela Y Roberts, MD;  Location: [facility];  Service: Gynecology;  Laterality: N/A;  Primary Cesarean Section Delivery Baby  Boy @   [phone], Apgars 8/9  . NO PAST SURGERIES      OB History    Gravida Para Term Preterm AB Living   5 1 1   3 1    SAB TAB Ectopic Multiple Live Births   2 1     1        Home Medications    Prior to Admission medications   Medication Sig Start Date End Date Taking? Authorizing Provider  cephALEXin (KEFLEX) 500 MG capsule Take 1 capsule (500 mg  total) by mouth 4 (four) times daily. 10/02/15   Dorse Locy C Adair Lemar, PA-C  ondansetron (ZOFRAN ODT) 4 MG disintegrating tablet Take 1 tablet (4 mg total) by mouth every 8 (eight) hours as needed for nausea or vomiting. 10/02/15   Teyana Pierron C Ogle Hoeffner, PA-C  traMADol (ULTRAM) 50 MG tablet Take 1 tablet (50 mg total) by mouth every 6 (six) hours as needed. 10/02/15   Anselm PancoastShawn C Daesha Insco, PA-C    Family History Family History  Problem Relation Age of Onset  . Asthma Brother   . Cancer Maternal Grandfather     Social History Social History  Substance Use Topics  . Smoking status: Never Smoker  . Smokeless tobacco: Never Used  . Alcohol use No     Allergies   Review of patient's allergies indicates no known allergies.   Review of Systems Review of Systems  Constitutional: Negative for chills and fever.  Gastrointestinal: Positive for abdominal pain, nausea and vomiting. Negative for diarrhea.  Genitourinary: Negative for dysuria.  All other systems reviewed and are negative.    Physical Exam Updated Vital Signs BP 124/89 (BP Location: Left Arm)   Pulse 98   Temp 98.1 F (36.7 C) (Oral)   Resp 20   Ht 5\' 2"  (1.575 m)   Wt 156 lb (70.8 kg)   LMP 09/09/2015   SpO2 98%   BMI 28.53 kg/m  Physical Exam  Constitutional: She appears well-developed and well-nourished. No distress.  HENT:  Head: Normocephalic and atraumatic.  Eyes: Conjunctivae are normal.  Neck: Neck supple.  Cardiovascular: Normal rate, regular rhythm, normal heart sounds and intact distal pulses.   Pulmonary/Chest: Effort normal and breath sounds normal. No respiratory distress.  Abdominal: Soft. There is tenderness. There is no guarding.  Generalized abdominal tenderness but increased on the LUQ and LLQ No CVA tenderness  Genitourinary:  Genitourinary Comments: External genitalia normal Vagina with discharge - moderate, white discharge Cervix  normal negative for cervical motion tenderness Adnexa palpated, no masses or  negative for tenderness noted Bladder palpated negative for tenderness Uterus palpated no masses or negative for tenderness Otherwise normal female genitalia. RN  served as Biomedical engineerchaperone during exam.  Musculoskeletal: She exhibits no edema or tenderness.  Lymphadenopathy:    She has no cervical adenopathy.  Neurological: She is alert.  Skin: Skin is warm and dry. She is not diaphoretic.  Psychiatric: She has a normal mood and affect. Her behavior is normal.  Nursing note and vitals reviewed.    ED Treatments / Results  DIAGNOSTIC STUDIES: Oxygen Saturation is 98% on RA, normal by my interpretation.    COORDINATION OF CARE: 10:58 PM- Pt advised of plan for treatment and pt agrees. Pt informed of her lab results. She will receive Zofran and Morphine.    Labs (all labs ordered are listed, but only abnormal results are displayed) Labs Reviewed  WET PREP, GENITAL - Abnormal; Notable for the following:       Result Value   Clue Cells Wet Prep HPF POC PRESENT (*)    WBC, Wet Prep HPF POC MANY (*)    All other components within normal limits  URINALYSIS, ROUTINE W REFLEX MICROSCOPIC (NOT AT [facility]) - Abnormal; Notable for the following:    APPearance CLOUDY (*)    Hgb urine dipstick LARGE (*)    Protein, ur 100 (*)    Leukocytes, UA LARGE (*)    All other components within normal limits  URINE MICROSCOPIC-ADD ON - Abnormal; Notable for the following:    Squamous Epithelial / LPF 6-30 (*)    Bacteria, UA MANY (*)    All other components within normal limits  CBC WITH DIFFERENTIAL/PLATELET - Abnormal; Notable for the following:    WBC 14.0 (*)    MCV 74.1 (*)    MCH 25.3 (*)    Neutro Abs 11.2 (*)    Monocytes Absolute 1.1 (*)    All other components within normal limits  COMPREHENSIVE METABOLIC PANEL - Abnormal; Notable for the following:    ALT 13 (*)    All other components within normal limits  URINE CULTURE  PREGNANCY, URINE  LIPASE, BLOOD  GC/CHLAMYDIA PROBE AMP ([facility]) NOT AT [facility]    EKG  EKG Interpretation None       Radiology No results found.  Procedures Procedures (including critical care time)  Medications Ordered in ED Medications  ondansetron (ZOFRAN) injection 4 mg (4 mg Intravenous Given 10/01/15 2317)  morphine 4 MG/ML injection 4 mg (4 mg Intravenous Given 10/01/15 2319)     Initial Impression / Assessment and Plan / ED Course  Harolyn RutherfordShawn Rafiq Bucklin, PA-C has reviewed the triage vital signs and the nursing notes.  Pertinent labs & imaging results that were available during my care of the patient were reviewed by me and considered in my medical decision making (see chart for details).  Clinical Course  Eryka Maiolo presents with cramping abdominal pain that began today.  Patient is nontoxic appearing, afebrile, not tachycardic, not tachypneic, not hypotensive, and is in no apparent distress. Patient has no signs of sepsis or other serious or life-threatening condition. Doubt ovarian torsion. Signs of UTI on UA. No tenderness on pelvic exam. Upon reassessment, patient has absolutely no abdominal tenderness and only endorses some "pressure" in the area of her bladder. Denies additional symptoms. Patient is noted to be smiling and resting comfortably. Clue cells noted on wet prep are likely an incidental finding. Patient has no complaints of vaginal discharge or odor. Patient to be treated as an UTI. The patient was given instructions for home care as well as return precautions. Patient voices understanding of these instructions, accepts the plan, and is comfortable with discharge.  Vitals:   10/01/15 2044 10/01/15 2045 10/01/15 2318  BP: 124/89  115/73  Pulse: 98  75  Resp: 20  16  Temp: 98.1 F (36.7 C)    TempSrc: Oral    SpO2: 98%  99%  Weight:  70.8 kg   Height:  5\' 2"  (1.575 m)      Final Clinical Impressions(s) / ED Diagnoses   Final diagnoses:  Acute cystitis without hematuria    New Prescriptions Discharge  Medication List as of 10/02/2015  1:21 AM     I personally performed the services described in this documentation, which was scribed in my presence. The recorded information has been reviewed and is accurate.    Anselm Pancoast, PA-C 10/02/15 0115    Anselm Pancoast, PA-C 10/02/15 0141    Dione Booze, MD 10/02/15 [phone]

## 2015-10-02 LAB — WET PREP, GENITAL
SPERM: NONE SEEN
Trich, Wet Prep: NONE SEEN
YEAST WET PREP: NONE SEEN

## 2015-10-02 MED ORDER — CEPHALEXIN 500 MG PO CAPS: 500.0000 mg | ORAL_CAPSULE | Freq: Four times a day (QID) | ORAL | 0 refills | Status: DC

## 2015-10-02 MED ORDER — ONDANSETRON 4 MG PO TBDP: 4.0000 mg | ORAL_TABLET | Freq: Three times a day (TID) | ORAL | 0 refills | Status: DC | PRN

## 2015-10-02 MED ORDER — TRAMADOL HCL 50 MG PO TABS: 50.0000 mg | ORAL_TABLET | Freq: Four times a day (QID) | ORAL | 0 refills | Status: DC | PRN

## 2015-10-02 NOTE — Discharge Instructions (Signed)
You have been seen today for abdominal pain.  There is evidence of a UTI on the urinalysis. Continue to take the antibiotics until they are gone. It is        possible that you have a kidney stone. These usually pass on their own, but you have been given urology follow up in case the pain continues. Return to the ED should  symptoms worsen.

## 2015-10-02 NOTE — ED Notes (Signed)
Pt verbalizes understanding of d/c instructions and denies any further needs at this time. 

## 2015-10-03 LAB — GC/CHLAMYDIA PROBE AMP (~~LOC~~) NOT AT ARMC
CHLAMYDIA, DNA PROBE: NEGATIVE
NEISSERIA GONORRHEA: NEGATIVE

## 2015-10-04 LAB — URINE CULTURE

## 2015-10-05 ENCOUNTER — Telehealth (HOSPITAL_BASED_OUTPATIENT_CLINIC_OR_DEPARTMENT_OTHER): Payer: Self-pay

## 2015-10-05 NOTE — Telephone Encounter (Signed)
Post ED Visit - Positive Culture Follow-up  Culture report reviewed by antimicrobial stewardship pharmacist:  []  Enzo BiNathan Batchelder, Pharm.D. []  Celedonio MiyamotoJeremy Frens, Pharm.D., BCPS []  Garvin FilaMike Maccia, Pharm.D. []  Georgina PillionElizabeth Martin, Pharm.D., BCPS []  Paradise HillMinh Pham, VermontPharm.D., BCPS, AAHIVP []  Estella HuskMichelle Turner, Pharm.D., BCPS, AAHIVP []  Tennis Mustassie Stewart, Pharm.D. []  Sherle Poeob Vincent, [address]Pharm[address] Lysle Pearlachel Rumbarger PharmD Positive urine culture Treated with Cephalexin, organism sensitive to the same and no further patient follow-up is required at this time.  Jerry CarasCullom, Jonpaul Lumm Burnett 10/05/2015, 11:18 AM

## 2016-06-10 ENCOUNTER — Emergency Department (HOSPITAL_BASED_OUTPATIENT_CLINIC_OR_DEPARTMENT_OTHER)
Admission: EM | Admit: 2016-06-10 | Discharge: 2016-06-11 | Disposition: A | Discharge status: Home / Self Care | Payer: Medicaid Other | Attending: Emergency Medicine | Admitting: Emergency Medicine

## 2016-06-10 ENCOUNTER — Encounter (HOSPITAL_BASED_OUTPATIENT_CLINIC_OR_DEPARTMENT_OTHER): Payer: Self-pay | Admitting: Emergency Medicine

## 2016-06-10 DIAGNOSIS — R1013 Epigastric pain: Secondary | ICD-10-CM | POA: Insufficient documentation

## 2016-06-10 DIAGNOSIS — R112 Nausea with vomiting, unspecified: Secondary | ICD-10-CM | POA: Diagnosis present

## 2016-06-10 NOTE — ED Triage Notes (Signed)
Patient states that she has had vomiting and abdominal burning x 3 weeks. The patient reports that she is having throat burning and her stomach is hurting worse. The patient reports that she can not tolerate any food other than chicken broth or water.

## 2016-06-11 LAB — COMPREHENSIVE METABOLIC PANEL
ALT: 16 U/L (ref 14–54)
AST: 25 U/L (ref 15–41)
Albumin: 3.9 g/dL (ref 3.5–5.0)
Alkaline Phosphatase: 49 U/L (ref 38–126)
Anion gap: 9 (ref 5–15)
BUN: 13 mg/dL (ref 6–20)
CALCIUM: 9.3 mg/dL (ref 8.9–10.3)
CO2: 27 mmol/L (ref 22–32)
CREATININE: 0.75 mg/dL (ref 0.44–1.00)
Chloride: 102 mmol/L (ref 101–111)
Glucose, Bld: 94 mg/dL (ref 65–99)
Potassium: 3.6 mmol/L (ref 3.5–5.1)
Sodium: 138 mmol/L (ref 135–145)
Total Bilirubin: 0.1 mg/dL — ABNORMAL LOW (ref 0.3–1.2)
Total Protein: 8 g/dL (ref 6.5–8.1)

## 2016-06-11 LAB — PREGNANCY, URINE: Preg Test, Ur: NEGATIVE

## 2016-06-11 LAB — CBC WITH DIFFERENTIAL/PLATELET
Basophils Absolute: 0 10*3/uL (ref 0.0–0.1)
Basophils Relative: 0 %
EOS PCT: 1 %
Eosinophils Absolute: 0.1 10*3/uL (ref 0.0–0.7)
HCT: 37.3 % (ref 36.0–46.0)
Hemoglobin: 12.9 g/dL (ref 12.0–15.0)
LYMPHS ABS: 2.8 10*3/uL (ref 0.7–4.0)
LYMPHS PCT: 33 %
MCH: 26 pg (ref 26.0–34.0)
MCHC: 34.6 g/dL (ref 30.0–36.0)
MCV: 75.2 fL — AB (ref 78.0–100.0)
MONO ABS: 0.8 10*3/uL (ref 0.1–1.0)
MONOS PCT: 9 %
Neutro Abs: 4.9 10*3/uL (ref 1.7–7.7)
Neutrophils Relative %: 57 %
PLATELETS: 286 10*3/uL (ref 150–400)
RBC: 4.96 MIL/uL (ref 3.87–5.11)
RDW: 13.8 % (ref 11.5–15.5)
WBC: 8.6 10*3/uL (ref 4.0–10.5)

## 2016-06-11 LAB — URINALYSIS, ROUTINE W REFLEX MICROSCOPIC
Bilirubin Urine: NEGATIVE
Glucose, UA: NEGATIVE mg/dL
HGB URINE DIPSTICK: NEGATIVE
KETONES UR: NEGATIVE mg/dL
Nitrite: NEGATIVE
PH: 7 (ref 5.0–8.0)
PROTEIN: NEGATIVE mg/dL
Specific Gravity, Urine: 1.021 (ref 1.005–1.030)

## 2016-06-11 LAB — URINALYSIS, MICROSCOPIC (REFLEX)

## 2016-06-11 MED ORDER — SUCRALFATE 1 G PO TABS
1.0000 g | ORAL_TABLET | Freq: Once | ORAL | Status: AC
Start: 2016-06-11 — End: 2016-06-11
  Administered 2016-06-11: 1 g via ORAL
  Filled 2016-06-11: qty 1

## 2016-06-11 MED ORDER — SUCRALFATE 1 G PO TABS: 1.0000 g | ORAL_TABLET | Freq: Three times a day (TID) | ORAL | 0 refills | Status: DC

## 2016-06-11 MED ORDER — OMEPRAZOLE 20 MG PO CPDR: DELAYED_RELEASE_CAPSULE | ORAL | 0 refills | Status: DC

## 2016-06-11 MED ORDER — ONDANSETRON 8 MG PO TBDP: 8.0000 mg | ORAL_TABLET | Freq: Three times a day (TID) | ORAL | 1 refills | Status: DC | PRN

## 2016-06-11 MED ORDER — GI COCKTAIL ~~LOC~~
30.0000 mL | Freq: Once | ORAL | Status: AC
  Administered 2016-06-11: 30 mL via ORAL
  Filled 2016-06-11: qty 30

## 2016-06-11 NOTE — ED Provider Notes (Signed)
[facility] Provider Note: Lowella Dell, MD, FACEP  CSN: [id number] MRN: [id number] ARRIVAL: 06/10/16 at 2322 ROOM: MH11/[facility]11   CHIEF COMPLAINT  Vomiting   HISTORY OF PRESENT ILLNESS  Latoya Sanchez is a 26 y.o. female who complains of nausea and vomiting for the past 3 weeks. She is here now with epigastric burning that began yesterday. This burning extends upward into her throat. Symptoms are moderate. This was not relieved with a GI cocktail given prior to my evaluation. She had also been taking Alka-Seltzer and Tums earlier in the week without relief. She rates her pain as a 5 out of 10. She has not been able to tolerate any food other than chicken broth or water.   Past Medical History:  Diagnosis Date  . No pertinent past medical history     Past Surgical History:  Procedure Laterality Date  . CESAREAN SECTION  03/09/2011   Procedure: CESAREAN SECTION;  Surgeon: Purcell Nails, MD;  Location: [facility];  Service: Gynecology;  Laterality: N/A;  Primary Cesarean Section Delivery Baby  Boy @   [phone], Apgars 8/9  . NO PAST SURGERIES      Family History  Problem Relation Age of Onset  . Asthma Brother   . Cancer Maternal Grandfather     Social History  Substance Use Topics  . Smoking status: Never Smoker  . Smokeless tobacco: Never Used  . Alcohol use No    Prior to Admission medications   Not on File    Allergies Patient has no known allergies.   REVIEW OF SYSTEMS  Negative except as noted here or in the History of Present Illness.   PHYSICAL EXAMINATION  Initial Vital Signs Blood pressure 108/69, pulse 75, temperature 98.3 F (36.8 C), temperature source Oral, resp. rate 16, height  (1.575 m), weight 156 lb (70.8 kg), last menstrual period 05/20/2016, SpO2 98 %.  Examination General: Well-developed, well-nourished female in no acute distress; appearance consistent with age of record HENT: normocephalic; atraumatic; no pharyngeal erythema  or edema; no dysphonia Eyes: pupils equal, round and reactive to light; extraocular muscles intact Neck: supple Heart: regular rate and rhythm Lungs: clear to auscultation bilaterally Abdomen: soft; nondistended; nontender; no masses or hepatosplenomegaly; bowel sounds present Extremities: No deformity; full range of motion; pulses normal Neurologic: Awake, alert and oriented; motor function intact in all extremities and symmetric; no facial droop Skin: Warm and dry Psychiatric: Normal mood and affect   RESULTS  Summary of this visit's results, reviewed by myself:   EKG Interpretation  Date/Time:    Ventricular Rate:    PR Interval:    QRS Duration:   QT Interval:    QTC Calculation:   R Axis:     Text Interpretation:        Laboratory Studies: Results for orders placed or performed during the hospital encounter of 06/10/16 (from the past 24 hour(s))  CBC with Differential     Status: Abnormal   Collection Time: 06/11/16 12:02 AM  Result Value Ref Range   WBC 8.6 4.0 - 10.5 K/uL   RBC 4.96 3.87 - 5.11 MIL/uL   Hemoglobin 12.9 12.0 - 15.0 g/dL   HCT 30.8 65.7 - 84.6 %   MCV 75.2 (L) 78.0 - 100.0 fL   MCH 26.0 26.0 - 34.0 pg   MCHC 34.6 30.0 - 36.0 g/dL   RDW 96.2 95.2 - 84.1 %   Platelets 286 150 - 400 K/uL   Neutrophils Relative %  57 %   Neutro Abs 4.9 1.7 - 7.7 K/uL   Lymphocytes Relative 33 %   Lymphs Abs 2.8 0.7 - 4.0 K/uL   Monocytes Relative 9 %   Monocytes Absolute 0.8 0.1 - 1.0 K/uL   Eosinophils Relative 1 %   Eosinophils Absolute 0.1 0.0 - 0.7 K/uL   Basophils Relative 0 %   Basophils Absolute 0.0 0.0 - 0.1 K/uL  Comprehensive metabolic panel     Status: Abnormal   Collection Time: 06/11/16 12:02 AM  Result Value Ref Range   Sodium 138 135 - 145 mmol/L   Potassium 3.6 3.5 - 5.1 mmol/L   Chloride 102 101 - 111 mmol/L   CO2 27 22 - 32 mmol/L   Glucose, Bld 94 65 - 99 mg/dL   BUN 13 6 - 20 mg/dL   Creatinine, Ser 1.61 0.44 - 1.00 mg/dL   Calcium 9.3  8.9 - 09.6 mg/dL   Total Protein 8.0 6.5 - 8.1 g/dL   Albumin 3.9 3.5 - 5.0 g/dL   AST 25 15 - 41 U/L   ALT 16 14 - 54 U/L   Alkaline Phosphatase 49 38 - 126 U/L   Total Bilirubin 0.1 (L) 0.3 - 1.2 mg/dL   GFR calc non Af Amer >60 >60 mL/min   GFR calc Af Amer >60 >60 mL/min   Anion gap 9 5 - 15  Urinalysis, Routine w reflex microscopic     Status: Abnormal   Collection Time: 06/11/16  2:11 AM  Result Value Ref Range   Color, Urine YELLOW YELLOW   APPearance TURBID (A) CLEAR   Specific Gravity, Urine 1.021 1.005 - 1.030   pH 7.0 5.0 - 8.0   Glucose, UA NEGATIVE NEGATIVE mg/dL   Hgb urine dipstick NEGATIVE NEGATIVE   Bilirubin Urine NEGATIVE NEGATIVE   Ketones, ur NEGATIVE NEGATIVE mg/dL   Protein, ur NEGATIVE NEGATIVE mg/dL   Nitrite NEGATIVE NEGATIVE   Leukocytes, UA LARGE (A) NEGATIVE  Pregnancy, urine     Status: None   Collection Time: 06/11/16  2:11 AM  Result Value Ref Range   Preg Test, Ur NEGATIVE NEGATIVE  Urinalysis, Microscopic (reflex)     Status: Abnormal   Collection Time: 06/11/16  2:11 AM  Result Value Ref Range   RBC / HPF 0-5 0 - 5 RBC/hpf   WBC, UA 6-30 0 - 5 WBC/hpf   Bacteria, UA MANY (A) NONE SEEN   Squamous Epithelial / LPF 6-30 (A) NONE SEEN   Amorphous Crystal PRESENT    Imaging Studies: No results found.  ED COURSE  Nursing notes and initial vitals signs, including pulse oximetry, reviewed.  Vitals:   06/10/16 2328 06/11/16 0132  BP: (!) 126/92 108/69  Pulse: 84 75  Resp: 18 16  Temp: 98.3 F (36.8 C)   TempSrc: Oral   SpO2: 100% 98%  Weight: 156 lb (70.8 kg)   Height:  (1.575 m)    2:51 AM No significant improvement with Carafate. Patient's symptomatology is consistent with gastritis/GERD. We will start her on a PPI and Carafate and treat her nausea. She was advised of reassuring lab work. Urine has been sent for culture.  PROCEDURES    ED DIAGNOSES     ICD-9-CM ICD-10-CM   1. Epigastric abdominal pain 789.06 R10.13   2.  Nausea and vomiting in adult 787.01 R11.2        Paula Libra, MD 06/11/16 [phone]

## 2016-06-11 NOTE — ED Notes (Signed)
c/o throat and abd burning x 3 weeks

## 2016-06-12 LAB — URINE CULTURE

## 2016-08-14 ENCOUNTER — Emergency Department (HOSPITAL_BASED_OUTPATIENT_CLINIC_OR_DEPARTMENT_OTHER)
Admission: EM | Admit: 2016-08-14 | Discharge: 2016-08-15 | Disposition: A | Discharge status: Home / Self Care | Payer: Medicaid Other | Attending: Emergency Medicine | Admitting: Emergency Medicine

## 2016-08-14 ENCOUNTER — Encounter (HOSPITAL_BASED_OUTPATIENT_CLINIC_OR_DEPARTMENT_OTHER): Payer: Self-pay | Admitting: *Deleted

## 2016-08-14 DIAGNOSIS — Z79899 Other long term (current) drug therapy: Secondary | ICD-10-CM | POA: Insufficient documentation

## 2016-08-14 DIAGNOSIS — N760 Acute vaginitis: Secondary | ICD-10-CM | POA: Insufficient documentation

## 2016-08-14 DIAGNOSIS — B9689 Other specified bacterial agents as the cause of diseases classified elsewhere: Secondary | ICD-10-CM

## 2016-08-14 DIAGNOSIS — R350 Frequency of micturition: Secondary | ICD-10-CM | POA: Diagnosis present

## 2016-08-14 LAB — URINALYSIS, ROUTINE W REFLEX MICROSCOPIC
BILIRUBIN URINE: NEGATIVE
Glucose, UA: NEGATIVE mg/dL
Hgb urine dipstick: NEGATIVE
KETONES UR: NEGATIVE mg/dL
Leukocytes, UA: NEGATIVE
NITRITE: NEGATIVE
Protein, ur: NEGATIVE mg/dL
Specific Gravity, Urine: 1.021 (ref 1.005–1.030)
pH: 6.5 (ref 5.0–8.0)

## 2016-08-14 LAB — PREGNANCY, URINE: PREG TEST UR: NEGATIVE

## 2016-08-14 NOTE — ED Provider Notes (Signed)
[facility]   CSN: [id number] Arrival date & time: 08/14/16  2156   By signing my name below, I, Soijett Blue, attest that this documentation has been prepared under the direction and in the presence of [place] Russo, PA-C Electronically Signed: Soijett Blue, ED Scribe. 08/14/16. 11:21 PM.  History   Chief Complaint Chief Complaint  Patient presents with  . Urinary Frequency    HPI Latoya Sanchez is a 26 y.o. female who presents to the Emergency Department complaining of urinary frequency onset 2 days ago. Pt reports associated vaginal discharge, urethral itching, heavy sensation to lower abdomen, and cramping sensation to lower back pain. Pt has tried OTC AZO with no relief of her symptoms. Pt last sexual intercourse was 3.5 weeks ago and was protected with a female partner. Patient's last menstrual period was 08/04/2016 and lasted 5 days. Denies being currently sexually active, recent abx use, or hx of DM. She denies flank pain, fever, chills, N/V, and any other symptoms.    The history is provided by the patient. No language interpreter was used.    Past Medical History:  Diagnosis Date  . No pertinent past medical history     Patient Active Problem List   Diagnosis Date Noted  . Anemia 03/12/2011  . Status post primary low transverse cesarean section 03/12/2011    Past Surgical History:  Procedure Laterality Date  . CESAREAN SECTION  03/09/2011   Procedure: CESAREAN SECTION;  Surgeon: Purcell Nails, MD;  Location: [facility];  Service: Gynecology;  Laterality: N/A;  Primary Cesarean Section Delivery Baby  Boy @   [phone], Apgars 8/9  . NO PAST SURGERIES      OB History    Gravida Para Term Preterm AB Living   5 1 1   3 1    SAB TAB Ectopic Multiple Live Births   2 1     1        Home Medications    Prior to Admission medications   Medication Sig Start Date End Date Taking? Authorizing Provider  metroNIDAZOLE (FLAGYL) 500 MG tablet Take 1  tablet (500 mg total) by mouth 2 (two) times daily. 08/15/16   Russo, [place] N, PA-C  omeprazole (PRILOSEC) 20 MG capsule Take one capsule every morning at least 30 minutes before first dose of Carafate. 06/11/16   Molpus, Jonny Ruiz, MD  ondansetron (ZOFRAN ODT) 8 MG disintegrating tablet Take 1 tablet (8 mg total) by mouth every 8 (eight) hours as needed for nausea or vomiting. 06/11/16   Molpus, Jonny Ruiz, MD  sucralfate (CARAFATE) 1 g tablet Take 1 tablet (1 g total) by mouth 4 (four) times daily -  with meals and at bedtime. 06/11/16   Molpus, John, MD    Family History Family History  Problem Relation Age of Onset  . Asthma Brother   . Cancer Maternal Grandfather     Social History Social History  Substance Use Topics  . Smoking status: Never Smoker  . Smokeless tobacco: Never Used  . Alcohol use No     Allergies   Patient has no known allergies.   Review of Systems Review of Systems  Constitutional: Negative for chills and fever.  Gastrointestinal:       +heavy sensation to abdomen  Genitourinary: Positive for frequency and vaginal discharge. Negative for flank pain.       +urethral itching  Musculoskeletal:       +cramping sensation to lower back     Physical Exam Updated  Vital Signs BP (!) 133/96 (BP Location: Right Arm)   Pulse 80   Temp 98.4 F (36.9 C) (Oral)   Resp 20   Ht 5\' 2"  (1.575 m)   Wt 156 lb (70.8 kg)   LMP 07/31/2016   SpO2 100%   BMI 28.53 kg/m   Physical Exam  Constitutional: She appears well-developed and well-nourished.  HENT:  Head: Normocephalic and atraumatic.  Eyes: Conjunctivae are normal.  Cardiovascular: Normal rate, regular rhythm, normal heart sounds and intact distal pulses.  Exam reveals no gallop and no friction rub.   No murmur heard. Pulmonary/Chest: Effort normal and breath sounds normal. No respiratory distress. She has no wheezes. She has no rales.  Abdominal: Soft. Normal appearance and bowel sounds are normal. She exhibits no  distension and no mass. There is no tenderness. There is no rebound, no guarding and no CVA tenderness. No hernia.  Genitourinary: Uterus normal. There is no tenderness on the right labia. There is no tenderness on the left labia. Cervix exhibits discharge ( White discharge) and friability. Cervix exhibits no motion tenderness. Right adnexum displays no tenderness. Left adnexum displays no tenderness. There is erythema in the vagina. No tenderness or bleeding in the vagina.  Genitourinary Comments: chaperone present during entire exam.  Psychiatric: She has a normal mood and affect. Her behavior is normal.  Nursing note and vitals reviewed.    ED Treatments / Results  DIAGNOSTIC STUDIES: Oxygen Saturation is 100% on RA, nl by my interpretation.    COORDINATION OF CARE: 11:18 PM Discussed treatment plan with pt at bedside and pt agreed to plan.   Labs (all labs ordered are listed, but only abnormal results are displayed) Labs Reviewed  WET PREP, GENITAL - Abnormal; Notable for the following:       Result Value   Clue Cells Wet Prep HPF POC PRESENT (*)    WBC, Wet Prep HPF POC FEW (*)    All other components within normal limits  URINALYSIS, ROUTINE W REFLEX MICROSCOPIC  PREGNANCY, URINE  GC/CHLAMYDIA PROBE AMP ([place]) NOT AT [facility]    EKG  EKG Interpretation None       Radiology No results found.  Procedures Procedures (including critical care time)  Medications Ordered in ED Medications - No data to display   Initial Impression / Assessment and Plan / ED Course  I have reviewed the triage vital signs and the nursing notes.  Pertinent labs & imaging results that were available during my care of the patient were reviewed by me and considered in my medical decision making (see chart for details).     Pt presenting with urinary frequency and dysuria. Abdominal exam benign. Urinalysis negative for evidence of UTI. Pelvic exam with cervical discharge, no CMT or  adnexal tenderness. Wet prep with clue cells and white blood cells. We will treat for bacterial vaginosis with Flagyl. Pt has been advised to not drink alcohol while on this medication. Patient states she has an appointment with her OBGYN on Tuesday; encouraged patient to attend this appointment for follow-up. Discussed importance of using protection when sexually active. Pt understands that they have GC/Chlamydia cultures pending and that they will need to inform all sexual partners if results return positive.   Pt afebrile and nontoxic, safe for discharge home.  Discussed results, findings, treatment and follow up. Patient advised of return precautions. Patient verbalized understanding and agreed with plan.  Final Clinical Impressions(s) / ED Diagnoses   Final diagnoses:  BV (bacterial  vaginosis)    New Prescriptions New Prescriptions   METRONIDAZOLE (FLAGYL) 500 MG TABLET    Take 1 tablet (500 mg total) by mouth 2 (two) times daily.   I personally performed the services described in this documentation, which was scribed in my presence. The recorded information has been reviewed and is accurate.     Russo, [place] N, PA-C 08/15/16 0040    Tilden Fossaees, Elizabeth, MD 08/24/16 1700

## 2016-08-14 NOTE — ED Triage Notes (Signed)
Pt reports 2 days of frequent urination and burning. Denies fevers or vomiting.

## 2016-08-15 LAB — WET PREP, GENITAL
Sperm: NONE SEEN
Trich, Wet Prep: NONE SEEN
YEAST WET PREP: NONE SEEN

## 2016-08-15 MED ORDER — METRONIDAZOLE 500 MG PO TABS: 500.0000 mg | ORAL_TABLET | Freq: Two times a day (BID) | ORAL | 0 refills | Status: DC

## 2016-08-15 NOTE — Discharge Instructions (Signed)
Please read the instructions below. Talk with your primary care provider about any new medications. Please attend your appointment with your OBGYN on Monday. Finish your antibiotic (Flagyl/Metronidazole) as prescribed. Do not drink alcohol with this medication as it will cause vomiting. You will receive a call from the hospital if your test results come back positive. Avoid sexual activity until you know your test results. If your results come back positive, it is important that you inform all of your sexual partners. Return to the ER for new or worsening symptoms.

## 2016-08-15 NOTE — ED Notes (Signed)
Pt given d/c instructions as per chart. Rx x 1. Verbalizes understanding. No questions. 

## 2016-08-16 LAB — GC/CHLAMYDIA PROBE AMP (~~LOC~~) NOT AT ARMC
Chlamydia: NEGATIVE
Neisseria Gonorrhea: NEGATIVE

## 2017-08-21 ENCOUNTER — Emergency Department (HOSPITAL_BASED_OUTPATIENT_CLINIC_OR_DEPARTMENT_OTHER): Payer: Medicaid Other

## 2017-08-21 ENCOUNTER — Other Ambulatory Visit: Payer: Self-pay

## 2017-08-21 ENCOUNTER — Emergency Department (HOSPITAL_BASED_OUTPATIENT_CLINIC_OR_DEPARTMENT_OTHER)
Admission: EM | Admit: 2017-08-21 | Discharge: 2017-08-22 | Disposition: A | Discharge status: Home / Self Care | Payer: Medicaid Other | Attending: Emergency Medicine | Admitting: Emergency Medicine

## 2017-08-21 ENCOUNTER — Encounter (HOSPITAL_BASED_OUTPATIENT_CLINIC_OR_DEPARTMENT_OTHER): Payer: Self-pay | Admitting: Emergency Medicine

## 2017-08-21 DIAGNOSIS — N390 Urinary tract infection, site not specified: Secondary | ICD-10-CM | POA: Diagnosis not present

## 2017-08-21 DIAGNOSIS — J392 Other diseases of pharynx: Secondary | ICD-10-CM | POA: Diagnosis not present

## 2017-08-21 DIAGNOSIS — Z79899 Other long term (current) drug therapy: Secondary | ICD-10-CM | POA: Insufficient documentation

## 2017-08-21 DIAGNOSIS — R1110 Vomiting, unspecified: Secondary | ICD-10-CM | POA: Diagnosis present

## 2017-08-21 DIAGNOSIS — R112 Nausea with vomiting, unspecified: Secondary | ICD-10-CM | POA: Diagnosis not present

## 2017-08-21 LAB — COMPREHENSIVE METABOLIC PANEL
ALBUMIN: 3.5 g/dL (ref 3.5–5.0)
ALT: 26 U/L (ref 0–44)
AST: 27 U/L (ref 15–41)
Alkaline Phosphatase: 50 U/L (ref 38–126)
Anion gap: 9 (ref 5–15)
BUN: 16 mg/dL (ref 6–20)
CHLORIDE: 105 mmol/L (ref 98–111)
CO2: 26 mmol/L (ref 22–32)
Calcium: 8.4 mg/dL — ABNORMAL LOW (ref 8.9–10.3)
Creatinine, Ser: 0.64 mg/dL (ref 0.44–1.00)
GFR calc Af Amer: >60 mL/min (ref >60)
GFR calc non Af Amer: >60 mL/min (ref >60)
GLUCOSE: 121 mg/dL — AB (ref 70–99)
Potassium: 3.4 mmol/L — ABNORMAL LOW (ref 3.5–5.1)
Sodium: 140 mmol/L (ref 135–145)
Total Bilirubin: 0.3 mg/dL (ref 0.3–1.2)
Total Protein: 7.4 g/dL (ref 6.5–8.1)

## 2017-08-21 LAB — DIFFERENTIAL
BASOS PCT: 0 %
Basophils Absolute: 0 10*3/uL (ref 0.0–0.1)
EOS PCT: 1 %
Eosinophils Absolute: 0.1 10*3/uL (ref 0.0–0.7)
Lymphocytes Relative: 21 %
Lymphs Abs: 1.7 10*3/uL (ref 0.7–4.0)
Monocytes Absolute: 0.7 10*3/uL (ref 0.1–1.0)
Monocytes Relative: 9 %
NEUTROS ABS: 5.7 10*3/uL (ref 1.7–7.7)
Neutrophils Relative %: 69 %

## 2017-08-21 LAB — URINALYSIS, MICROSCOPIC (REFLEX): Squamous Epithelial / LPF: >50 (ref 0–5)

## 2017-08-21 LAB — CBC
HCT: 37.2 % (ref 36.0–46.0)
Hemoglobin: 12.7 g/dL (ref 12.0–15.0)
MCH: 25.5 pg — AB (ref 26.0–34.0)
MCHC: 34.1 g/dL (ref 30.0–36.0)
MCV: 74.5 fL — AB (ref 78.0–100.0)
PLATELETS: 310 10*3/uL (ref 150–400)
RBC: 4.99 MIL/uL (ref 3.87–5.11)
RDW: 13.9 % (ref 11.5–15.5)
WBC: 8.2 10*3/uL (ref 4.0–10.5)

## 2017-08-21 LAB — PREGNANCY, URINE: PREG TEST UR: NEGATIVE

## 2017-08-21 LAB — URINALYSIS, ROUTINE W REFLEX MICROSCOPIC
BILIRUBIN URINE: NEGATIVE
GLUCOSE, UA: NEGATIVE mg/dL
HGB URINE DIPSTICK: NEGATIVE
Ketones, ur: NEGATIVE mg/dL
Nitrite: NEGATIVE
Protein, ur: NEGATIVE mg/dL
SPECIFIC GRAVITY, URINE: 1.025 (ref 1.005–1.030)
pH: 6.5 (ref 5.0–8.0)

## 2017-08-21 LAB — LIPASE, BLOOD: LIPASE: 42 U/L (ref 11–51)

## 2017-08-21 MED ORDER — ONDANSETRON HCL 4 MG/2ML IJ SOLN
4.0000 mg | Freq: Once | INTRAMUSCULAR | Status: AC
  Administered 2017-08-21: 4 mg via INTRAVENOUS
  Filled 2017-08-21: qty 2

## 2017-08-21 NOTE — ED Triage Notes (Signed)
Patient states that she has a discomfort in there throat " it feels like a ball just sitting in my throat" -- she reports that she has had vomiting for the last 2 -3 days

## 2017-08-21 NOTE — ED Provider Notes (Signed)
[facility] Provider Note: Lowella Dell, MD, FACEP  CSN: [id number] MRN: [id number] ARRIVAL: 08/21/17 at 2031 ROOM: [facility]   CHIEF COMPLAINT  Vomiting   HISTORY OF PRESENT ILLNESS  08/21/17 10:52 PM Latoya Sanchez is a 27 y.o. female with about a 5-day history of nausea, worse in the mornings.  For the past 3 days she has had vomiting anytime she eats.  She has a sensation of something being in her throat and when she swallows it makes her nauseated.  She is not aware of having a fever.  She denies a sore throat.  He denies abdominal pain.  She denies constipation or diarrhea.  Her last bowel movement was earlier today and was normal.   Past Medical History:  Diagnosis Date  . No pertinent past medical history     Past Surgical History:  Procedure Laterality Date  . CESAREAN SECTION  03/09/2011   Procedure: CESAREAN SECTION;  Surgeon: Purcell Nails, MD;  Location: [facility];  Service: Gynecology;  Laterality: N/A;  Primary Cesarean Section Delivery Baby  Boy @   [phone], Apgars 8/9  . NO PAST SURGERIES      Family History  Problem Relation Age of Onset  . Asthma Brother   . Cancer Maternal Grandfather     Social History   Tobacco Use  . Smoking status: Never Smoker  . Smokeless tobacco: Never Used  Substance Use Topics  . Alcohol use: No  . Drug use: No    Prior to Admission medications   Medication Sig Start Date End Date Taking? Authorizing Provider  metroNIDAZOLE (FLAGYL) 500 MG tablet Take 1 tablet (500 mg total) by mouth 2 (two) times daily. 08/15/16   Robinson, [place] N, PA-C  omeprazole (PRILOSEC) 20 MG capsule Take one capsule every morning at least 30 minutes before first dose of Carafate. 06/11/16   Keyundra Fant, Jonny Ruiz, MD  ondansetron (ZOFRAN ODT) 8 MG disintegrating tablet Take 1 tablet (8 mg total) by mouth every 8 (eight) hours as needed for nausea or vomiting. 06/11/16   Dezaray Shibuya, Jonny Ruiz, MD  sucralfate (CARAFATE) 1 g tablet Take 1 tablet (1 g total) by  mouth 4 (four) times daily -  with meals and at bedtime. 06/11/16   Jada Fass, MD  polysaccharide iron (NIFEREX) 150 MG CAPS capsule Take 1 capsule (150 mg total) by mouth daily. Patient not taking: Reported on 05/20/2014 03/12/11 11/25/14  Nigel Bridgeman, CNM  sodium chloride (OCEAN NASAL SPRAY) 0.65 % nasal spray Place 1 spray into the nose as needed for congestion. Patient not taking: Reported on 05/20/2014 07/27/12 11/25/14  Francee Piccolo, PA-C    Allergies Patient has no known allergies.   REVIEW OF SYSTEMS  Negative except as noted here or in the History of Present Illness.   PHYSICAL EXAMINATION  Initial Vital Signs Blood pressure 119/83, pulse 80, temperature 99.1 F (37.3 C), temperature source Oral, resp. rate 18, height 5\' 2"  (1.575 m), weight 81.2 kg (179 lb), last menstrual period 07/31/2017, SpO2 100 %.  Examination General: Well-developed, well-nourished female in no acute distress; appearance consistent with age of record HENT: normocephalic; atraumatic; cobblestoning of pharynx without erythema or exudate; uvula midline and of normal appearance; no dysphonia; no stridor Eyes: pupils equal, round and reactive to light; extraocular muscles intact Neck: supple; no thyromegaly; palpation of anterior neck worsens patient's nausea Heart: regular rate and rhythm Lungs: clear to auscultation bilaterally Abdomen: soft; nondistended; nontender; no masses or hepatosplenomegaly; bowel sounds present Extremities: No deformity;  full range of motion; pulses normal Neurologic: Awake, alert and oriented; motor function intact in all extremities and symmetric; no facial droop Skin: Warm and dry Psychiatric: Normal mood and affect   RESULTS  Summary of this visit's results, reviewed by myself:   EKG Interpretation  Date/Time:    Ventricular Rate:    PR Interval:    QRS Duration:   QT Interval:    QTC Calculation:   R Axis:     Text Interpretation:        Laboratory  Studies: Results for orders placed or performed during the hospital encounter of 08/21/17 (from the past 24 hour(s))  Lipase, blood     Status: None   Collection Time: 08/21/17 10:04 PM  Result Value Ref Range   Lipase 42 11 - 51 U/L  Comprehensive metabolic panel     Status: Abnormal   Collection Time: 08/21/17 10:04 PM  Result Value Ref Range   Sodium 140 135 - 145 mmol/L   Potassium 3.4 (L) 3.5 - 5.1 mmol/L   Chloride 105 98 - 111 mmol/L   CO2 26 22 - 32 mmol/L   Glucose, Bld 121 (H) 70 - 99 mg/dL   BUN 16 6 - 20 mg/dL   Creatinine, Ser 4.130.64 0.44 - 1.00 mg/dL   Calcium 8.4 (L) 8.9 - 10.3 mg/dL   Total Protein 7.4 6.5 - 8.1 g/dL   Albumin 3.5 3.5 - 5.0 g/dL   AST 27 15 - 41 U/L   ALT 26 0 - 44 U/L   Alkaline Phosphatase 50 38 - 126 U/L   Total Bilirubin 0.3 0.3 - 1.2 mg/dL   GFR calc non Af Amer >60 >60 mL/min   GFR calc Af Amer >60 >60 mL/min   Anion gap 9 5 - 15  CBC     Status: Abnormal   Collection Time: 08/21/17 10:04 PM  Result Value Ref Range   WBC 8.2 4.0 - 10.5 K/uL   RBC 4.99 3.87 - 5.11 MIL/uL   Hemoglobin 12.7 12.0 - 15.0 g/dL   HCT 24.437.2 01.036.0 - 27.246.0 %   MCV 74.5 (L) 78.0 - 100.0 fL   MCH 25.5 (L) 26.0 - 34.0 pg   MCHC 34.1 30.0 - 36.0 g/dL   RDW 53.613.9 64.411.5 - 03.415.5 %   Platelets 310 150 - 400 K/uL  Differential     Status: None   Collection Time: 08/21/17 10:04 PM  Result Value Ref Range   Neutrophils Relative % 69 %   Lymphocytes Relative 21 %   Monocytes Relative 9 %   Eosinophils Relative 1 %   Basophils Relative 0 %   Neutro Abs 5.7 1.7 - 7.7 K/uL   Lymphs Abs 1.7 0.7 - 4.0 K/uL   Monocytes Absolute 0.7 0.1 - 1.0 K/uL   Eosinophils Absolute 0.1 0.0 - 0.7 K/uL   Basophils Absolute 0.0 0.0 - 0.1 K/uL   Smear Review MORPHOLOGY UNREMARKABLE   Urinalysis, Routine w reflex microscopic     Status: Abnormal   Collection Time: 08/21/17 11:30 PM  Result Value Ref Range   Color, Urine YELLOW YELLOW   APPearance CLOUDY (A) CLEAR   Specific Gravity, Urine  1.025 1.005 - 1.030   pH 6.5 5.0 - 8.0   Glucose, UA NEGATIVE NEGATIVE mg/dL   Hgb urine dipstick NEGATIVE NEGATIVE   Bilirubin Urine NEGATIVE NEGATIVE   Ketones, ur NEGATIVE NEGATIVE mg/dL   Protein, ur NEGATIVE NEGATIVE mg/dL   Nitrite NEGATIVE NEGATIVE   Leukocytes,  UA MODERATE (A) NEGATIVE  Pregnancy, urine     Status: None   Collection Time: 08/21/17 11:30 PM  Result Value Ref Range   Preg Test, Ur NEGATIVE NEGATIVE  Urinalysis, Microscopic (reflex)     Status: Abnormal   Collection Time: 08/21/17 11:30 PM  Result Value Ref Range   RBC / HPF 0-5 0 - 5 RBC/hpf   WBC, UA 21-50 0 - 5 WBC/hpf   Bacteria, UA MANY (A) NONE SEEN   Squamous Epithelial / LPF >50 0 - 5   Imaging Studies: Dg Neck Soft Tissue  Result Date: 08/21/2017 CLINICAL DATA:  Foreign body sensation in throat. EXAM: NECK SOFT TISSUES - 1+ VIEW COMPARISON:  None. FINDINGS: There is no evidence of retropharyngeal soft tissue swelling or epiglottic enlargement. The cervical airway is unremarkable. No radio-opaque foreign body identified. Soft tissue planes are non suspicious. IMPRESSION: Negative soft tissue neck radiographs. Electronically Signed   By: Rubye Oaks M.D.   On: 08/21/2017 23:19    ED COURSE and MDM  Nursing notes and initial vitals signs, including pulse oximetry, reviewed.  Vitals:   08/21/17 2041 08/21/17 2045 08/21/17 2331  BP: 119/83  115/84  Pulse: 80  67  Resp: 18  19  Temp: 99.1 F (37.3 C)  98.8 F (37.1 C)  TempSrc: Oral  Oral  SpO2: 100%  99%  Weight:  81.2 kg (179 lb)   Height:  5\' 2"  (1.575 m)    The cobblestoning of the patient's throat is nonspecific but does correlate with her symptoms.  The cause is unclear.  Her urinalysis is consistent with a urinary tract infection and we will treat accordingly.  We will treat her nausea and refer her to ENT for further evaluation of her throat.  PROCEDURES    ED DIAGNOSES     ICD-10-CM   1. Nausea and vomiting in adult R11.2   2.  Throat irritation J39.2   3. Lower urinary tract infectious disease N39.0        Levone Otten, MD 08/22/17 0020

## 2017-08-22 MED ORDER — CEPHALEXIN 500 MG PO CAPS: 500.0000 mg | ORAL_CAPSULE | Freq: Two times a day (BID) | ORAL | 0 refills | Status: DC

## 2017-08-22 MED ORDER — ONDANSETRON 8 MG PO TBDP: 8.0000 mg | ORAL_TABLET | Freq: Three times a day (TID) | ORAL | 1 refills | Status: AC | PRN

## 2017-08-22 MED ORDER — CEPHALEXIN 250 MG PO CAPS
500.0000 mg | ORAL_CAPSULE | Freq: Once | ORAL | Status: AC
  Administered 2017-08-22: 500 mg via ORAL
  Filled 2017-08-22: qty 2

## 2017-08-23 LAB — URINE CULTURE

## 2019-05-20 ENCOUNTER — Other Ambulatory Visit: Payer: Self-pay

## 2019-05-20 ENCOUNTER — Ambulatory Visit (HOSPITAL_COMMUNITY)
Admission: EM | Admit: 2019-05-20 | Discharge: 2019-05-20 | Disposition: A | Discharge status: Home / Self Care | Payer: Medicaid Other

## 2019-05-20 ENCOUNTER — Encounter (HOSPITAL_COMMUNITY): Payer: Self-pay

## 2019-05-20 DIAGNOSIS — J3489 Other specified disorders of nose and nasal sinuses: Secondary | ICD-10-CM | POA: Diagnosis not present

## 2019-05-20 MED ORDER — CETIRIZINE HCL 10 MG PO TABS: 10.0000 mg | ORAL_TABLET | Freq: Every day | ORAL | 0 refills | Status: DC

## 2019-05-20 MED ORDER — METHYLPREDNISOLONE ACETATE 40 MG/ML IJ SUSP
INTRAMUSCULAR | Status: AC
Start: 2019-05-20 — End: ?
  Filled 2019-05-20: qty 1

## 2019-05-20 MED ORDER — METHYLPREDNISOLONE ACETATE 40 MG/ML IJ SUSP
40.0000 mg | Freq: Once | INTRAMUSCULAR | Status: AC
  Administered 2019-05-20: 18:00:00 40 mg via INTRAMUSCULAR

## 2019-05-20 MED ORDER — TRIAMCINOLONE ACETONIDE 55 MCG/ACT NA AERO: 2.0000 | INHALATION_SPRAY | Freq: Every day | NASAL | 0 refills | Status: AC

## 2019-05-20 NOTE — ED Provider Notes (Signed)
[facility]    CSN: [id number] Arrival date & time: 05/20/19  1707      History   Chief Complaint Chief Complaint  Patient presents with  . Sinus Problem    HPI Latoya Sanchez is a 29 y.o. female.   Latoya Sanchez presents with complaints of pressure under eyes, to face, and into ears. Started 3 days ago. Worsening. No fevers. No nasal drainage. No cough. No dizziness. No sore throat. No gi symptoms. Took 1 amoxicillin last night which she had left over. as well as advil sinus and cold, feels like this helped some. Has had similar in the past, typically approximately 3 times a year.  No known ill contacts. Doesn't follow with ENT in the past.     ROS per HPI, negative if not otherwise mentioned.      Past Medical History:  Diagnosis Date  . No pertinent past medical history     Patient Active Problem List   Diagnosis Date Noted  . Anemia 03/12/2011  . Status post primary low transverse cesarean section 03/12/2011    Past Surgical History:  Procedure Laterality Date  . CESAREAN SECTION  03/09/2011   Procedure: CESAREAN SECTION;  Surgeon: Purcell Nails, MD;  Location: [facility];  Service: Gynecology;  Laterality: N/A;  Primary Cesarean Section Delivery Baby  Boy @   [phone], Apgars 8/9  . NO PAST SURGERIES      OB History    Gravida  5   Para  1   Term  1   Preterm      AB  3   Living  1     SAB  2   TAB  1   Ectopic      Multiple      Live Births  1            Home Medications    Prior to Admission medications   Medication Sig Start Date End Date Taking? Authorizing Provider  spironolactone (ALDACTONE) 100 MG tablet Take 150 mg by mouth daily.   Yes [provider]  cephALEXin (KEFLEX) 500 MG capsule Take 1 capsule (500 mg total) by mouth 2 (two) times daily. 08/22/17   Molpus, John, MD  cetirizine (ZYRTEC) 10 MG tablet Take 1 tablet (10 mg total) by mouth daily. 05/20/19   Georgetta Haber, NP  omeprazole (PRILOSEC) 20 MG  capsule Take one capsule every morning at least 30 minutes before first dose of Carafate. 06/11/16   Molpus, John, MD  ondansetron (ZOFRAN ODT) 8 MG disintegrating tablet Take 1 tablet (8 mg total) by mouth every 8 (eight) hours as needed for nausea or vomiting. 08/22/17   Molpus, John, MD  sucralfate (CARAFATE) 1 g tablet Take 1 tablet (1 g total) by mouth 4 (four) times daily -  with meals and at bedtime. 06/11/16   Molpus, John, MD  triamcinolone (NASACORT) 55 MCG/ACT AERO nasal inhaler Place 2 sprays into the nose daily. 05/20/19   Georgetta Haber, NP    Family History Family History  Problem Relation Age of Onset  . Asthma Brother   . Cancer Maternal Grandfather     Social History Social History   Tobacco Use  . Smoking status: Never Smoker  . Smokeless tobacco: Never Used  Substance Use Topics  . Alcohol use: No  . Drug use: No     Allergies   Patient has no known allergies.   Review of Systems Review of Systems  Physical Exam Triage Vital Signs ED Triage Vitals  Enc Vitals Group     BP 05/20/19 1750 124/78     Pulse Rate 05/20/19 1750 78     Resp 05/20/19 1750 18     Temp 05/20/19 1750 98.4 F (36.9 C)     Temp Source 05/20/19 1750 Oral     SpO2 05/20/19 1750 100 %     Weight --      Height --      Head Circumference --      Peak Flow --      Pain Score 05/20/19 1748 8     Pain Loc --      Pain Edu? --      Excl. in GC? --    No data found.  Updated Vital Signs BP 124/78 (BP Location: Right Arm)   Pulse 78   Temp 98.4 F (36.9 C) (Oral)   Resp 18   LMP 05/06/2019 (Within Days)   SpO2 100%    Physical Exam Constitutional:      General: She is not in acute distress.    Appearance: She is well-developed.  HENT:     Nose:     Right Sinus: Maxillary sinus tenderness and frontal sinus tenderness present.     Left Sinus: Maxillary sinus tenderness and frontal sinus tenderness present.  Cardiovascular:     Rate and Rhythm: Normal rate.  Pulmonary:      Effort: Pulmonary effort is normal.  Skin:    General: Skin is warm and dry.  Neurological:     Mental Status: She is alert and oriented to person, place, and time.      UC Treatments / Results  Labs (all labs ordered are listed, but only abnormal results are displayed) Labs Reviewed - No data to display  EKG   Radiology No results found.  Procedures Procedures (including critical care time)  Medications Ordered in UC Medications  methylPREDNISolone acetate (DEPO-MEDROL) injection 40 mg (40 mg Intramuscular Given 05/20/19 1815)    Initial Impression / Assessment and Plan / UC Course  I have reviewed the triage vital signs and the nursing notes.  Pertinent labs & imaging results that were available during my care of the patient were reviewed by me and considered in my medical decision making (see chart for details).     3 days of worsening sinus pressure. Afebrile. Benign physical exam. im depo medrol provided. Encouraged regular nasal spray. If symptoms worsen or do not improve in the next week to return to be seen or to follow up with PCP.  Patient verbalized understanding and agreeable to plan.   Final Clinical Impressions(s) / UC Diagnoses   Final diagnoses:  Sinus pressure     Discharge Instructions     Push fluids to ensure adequate hydration and keep secretions thin.  Daily nasal spray, most effective if used regularly.  Daily allergy medication.  We have provided you with a steroid shot here today which I am hopeful will provide some relief.  Continue to follow with your primary care provider as needed for persistent or recurrent symptoms.     ED Prescriptions    Medication Sig Dispense Auth. Provider   triamcinolone (NASACORT) 55 MCG/ACT AERO nasal inhaler Place 2 sprays into the nose daily. 1 Inhaler Linus Mako B, NP   cetirizine (ZYRTEC) 10 MG tablet Take 1 tablet (10 mg total) by mouth daily. 30 tablet Georgetta Haber, NP     PDMP not  reviewed this encounter.   Zigmund Gottron, NP 05/20/19 2203

## 2019-05-20 NOTE — Discharge Instructions (Signed)
Push fluids to ensure adequate hydration and keep secretions thin.  Daily nasal spray, most effective if used regularly.  Daily allergy medication.  We have provided you with a steroid shot here today which I am hopeful will provide some relief.  Continue to follow with your primary care provider as needed for persistent or recurrent symptoms.

## 2019-05-20 NOTE — ED Notes (Signed)
Called pt in waiting room in effort to bring back into a tx room-no answer.

## 2019-05-20 NOTE — ED Triage Notes (Signed)
C/o sinus pressure x2 days. Patient reports she gets this two to three times a year. Reports she took some left over amoxicillin she had and provided relief.

## 2020-07-10 ENCOUNTER — Other Ambulatory Visit: Payer: Self-pay

## 2020-07-10 ENCOUNTER — Emergency Department (HOSPITAL_BASED_OUTPATIENT_CLINIC_OR_DEPARTMENT_OTHER)
Admission: EM | Admit: 2020-07-10 | Discharge: 2020-07-11 | Disposition: A | Discharge status: Home / Self Care | Payer: Medicaid Other | Attending: Emergency Medicine | Admitting: Emergency Medicine

## 2020-07-10 ENCOUNTER — Encounter (HOSPITAL_BASED_OUTPATIENT_CLINIC_OR_DEPARTMENT_OTHER): Payer: Self-pay | Admitting: Emergency Medicine

## 2020-07-10 DIAGNOSIS — R1032 Left lower quadrant pain: Secondary | ICD-10-CM | POA: Insufficient documentation

## 2020-07-10 DIAGNOSIS — R1033 Periumbilical pain: Secondary | ICD-10-CM | POA: Diagnosis not present

## 2020-07-10 DIAGNOSIS — R109 Unspecified abdominal pain: Secondary | ICD-10-CM

## 2020-07-10 NOTE — ED Triage Notes (Signed)
Pt states abd cramping from lower mid abd radiates left to back. X 2-3 weeks, was see by Primary and was told to take fiber but no relief and pain worse today.

## 2020-07-11 ENCOUNTER — Emergency Department (HOSPITAL_BASED_OUTPATIENT_CLINIC_OR_DEPARTMENT_OTHER): Payer: Medicaid Other

## 2020-07-11 LAB — CBC WITH DIFFERENTIAL/PLATELET
Abs Immature Granulocytes: 0.01 10*3/uL (ref 0.00–0.07)
Basophils Absolute: 0.1 10*3/uL (ref 0.0–0.1)
Basophils Relative: 1 %
Eosinophils Absolute: 0.1 10*3/uL (ref 0.0–0.5)
Eosinophils Relative: 1 %
HCT: 37.9 % (ref 36.0–46.0)
Hemoglobin: 12.4 g/dL (ref 12.0–15.0)
Immature Granulocytes: 0 %
Lymphocytes Relative: 37 %
Lymphs Abs: 3.4 10*3/uL (ref 0.7–4.0)
MCH: 25.4 pg — ABNORMAL LOW (ref 26.0–34.0)
MCHC: 32.7 g/dL (ref 30.0–36.0)
MCV: 77.5 fL — ABNORMAL LOW (ref 80.0–100.0)
Monocytes Absolute: 0.7 10*3/uL (ref 0.1–1.0)
Monocytes Relative: 7 %
Neutro Abs: 5 10*3/uL (ref 1.7–7.7)
Neutrophils Relative %: 54 %
Platelets: 306 10*3/uL (ref 150–400)
RBC: 4.89 MIL/uL (ref 3.87–5.11)
RDW: 13.9 % (ref 11.5–15.5)
WBC: 9.1 10*3/uL (ref 4.0–10.5)
nRBC: 0 % (ref 0.0–0.2)

## 2020-07-11 LAB — URINALYSIS, ROUTINE W REFLEX MICROSCOPIC
Bilirubin Urine: NEGATIVE
Glucose, UA: NEGATIVE mg/dL
Hgb urine dipstick: NEGATIVE
Ketones, ur: NEGATIVE mg/dL
Leukocytes,Ua: NEGATIVE
Nitrite: NEGATIVE
Protein, ur: NEGATIVE mg/dL
Specific Gravity, Urine: <1.005 — ABNORMAL LOW (ref 1.005–1.030)
pH: 6.5 (ref 5.0–8.0)

## 2020-07-11 LAB — COMPREHENSIVE METABOLIC PANEL
ALT: 13 U/L (ref 0–44)
AST: 19 U/L (ref 15–41)
Albumin: 4 g/dL (ref 3.5–5.0)
Alkaline Phosphatase: 59 U/L (ref 38–126)
Anion gap: 7 (ref 5–15)
BUN: 10 mg/dL (ref 6–20)
CO2: 25 mmol/L (ref 22–32)
Calcium: 9 mg/dL (ref 8.9–10.3)
Chloride: 106 mmol/L (ref 98–111)
Creatinine, Ser: 0.62 mg/dL (ref 0.44–1.00)
GFR, Estimated: >60 mL/min (ref >60)
Glucose, Bld: 97 mg/dL (ref 70–99)
Potassium: 3.6 mmol/L (ref 3.5–5.1)
Sodium: 138 mmol/L (ref 135–145)
Total Bilirubin: 0.2 mg/dL — ABNORMAL LOW (ref 0.3–1.2)
Total Protein: 7.8 g/dL (ref 6.5–8.1)

## 2020-07-11 LAB — PREGNANCY, URINE: Preg Test, Ur: NEGATIVE

## 2020-07-11 LAB — LIPASE, BLOOD: Lipase: 42 U/L (ref 11–51)

## 2020-07-11 NOTE — ED Provider Notes (Signed)
[facility] EMERGENCY DEPARTMENT Provider Note  CSN: [id number] Arrival date & time: 07/10/20 2339  Chief Complaint(s) Abdominal Pain  HPI Latoya Sanchez is a 30 y.o. female   The history is provided by the patient.  Abdominal Pain Pain location:  LLQ, L flank and periumbilical Pain quality: not aching   Pain radiates to:  L flank Pain severity:  Moderate Onset quality:  Gradual Duration: intermittent for 2-3 weeks. constast since this morning. Chronicity:  New Relieved by:  Nothing Worsened by:  Nothing Ineffective treatments:  Bowel activity and OTC medications Associated symptoms: no constipation, no diarrhea, no dysuria, no fever, no nausea, no vaginal bleeding, no vaginal discharge and no vomiting    LMP: 5/17  Seen by PCP on 5/11 and had reassuring pelvic exam. Treated for possible constipation.  Past Medical History Past Medical History:  Diagnosis Date  . No pertinent past medical history    Patient Active Problem List   Diagnosis Date Noted  . Anemia 03/12/2011  . Status post primary low transverse cesarean section 03/12/2011   Home Medication(s) Prior to Admission medications   Medication Sig Start Date End Date Taking? Authorizing Provider  etonogestrel (NEXPLANON) 68 MG IMPL implant  05/06/13  Yes [provider]  cephALEXin (KEFLEX) 500 MG capsule Take 1 capsule (500 mg total) by mouth 2 (two) times daily. 08/22/17   Molpus, John, MD  cetirizine (ZYRTEC) 10 MG tablet Take 1 tablet (10 mg total) by mouth daily. 05/20/19   Georgetta Haber, NP  ISOtretinoin (ACCUTANE) 40 MG capsule Take 40 mg by mouth 2 (two) times daily. 06/25/20   [provider]  omeprazole (PRILOSEC) 20 MG capsule Take one capsule every morning at least 30 minutes before first dose of Carafate. 06/11/16   Molpus, John, MD  ondansetron (ZOFRAN ODT) 8 MG disintegrating tablet Take 1 tablet (8 mg total) by mouth every 8 (eight) hours as needed for nausea or vomiting.  08/22/17   Molpus, John, MD  spironolactone (ALDACTONE) 100 MG tablet Take 150 mg by mouth daily.    [provider]  sucralfate (CARAFATE) 1 g tablet Take 1 tablet (1 g total) by mouth 4 (four) times daily -  with meals and at bedtime. 06/11/16   Molpus, John, MD  triamcinolone (NASACORT) 55 MCG/ACT AERO nasal inhaler Place 2 sprays into the nose daily. 05/20/19   Georgetta Haber, NP                                                                                                                                    Past Surgical History Past Surgical History:  Procedure Laterality Date  . CESAREAN SECTION  03/09/2011   Procedure: CESAREAN SECTION;  Surgeon: Purcell Nails, MD;  Location: [facility];  Service: Gynecology;  Laterality: N/A;  Primary Cesarean Section Delivery Baby  Boy @   [phone], Apgars 8/9  . NO PAST SURGERIES  Family History Family History  Problem Relation Age of Onset  . Asthma Brother   . Cancer Maternal Grandfather     Social History Social History   Tobacco Use  . Smoking status: Never Smoker  . Smokeless tobacco: Never Used  Vaping Use  . Vaping Use: Never used  Substance Use Topics  . Alcohol use: No  . Drug use: No   Allergies Patient has no known allergies.  Review of Systems Review of Systems  Constitutional: Negative for fever.  Gastrointestinal: Positive for abdominal pain. Negative for constipation, diarrhea, nausea and vomiting.  Genitourinary: Negative for dysuria, vaginal bleeding and vaginal discharge.   All other systems are reviewed and are negative for acute change except as noted in the HPI  Physical Exam Vital Signs  I have reviewed the triage vital signs BP 118/90 (BP Location: Right Arm)   Pulse 69   Temp 98.2 F (36.8 C) (Oral)   Resp 18   Ht 5\' 2"  (1.575 m)   Wt 71.4 kg   LMP 07/01/2020   SpO2 100%   BMI 28.81 kg/m   Physical Exam Vitals reviewed.  Constitutional:      General: She is not in acute distress.     Appearance: She is well-developed. She is not diaphoretic.  HENT:     Head: Normocephalic and atraumatic.     Right Ear: External ear normal.     Left Ear: External ear normal.     Nose: Nose normal.  Eyes:     General: No scleral icterus.    Conjunctiva/sclera: Conjunctivae normal.  Neck:     Trachea: Phonation normal.  Cardiovascular:     Rate and Rhythm: Normal rate and regular rhythm.  Pulmonary:     Effort: Pulmonary effort is normal. No respiratory distress.     Breath sounds: No stridor.  Abdominal:     General: There is no distension.     Tenderness: There is abdominal tenderness in the periumbilical area. There is no guarding or rebound. Negative signs include Murphy's sign, Rovsing's sign and McBurney's sign.     Comments: Abdominoplasty remote scars  Musculoskeletal:        General: Normal range of motion.     Cervical back: Normal range of motion.  Neurological:     Mental Status: She is alert and oriented to person, place, and time.  Psychiatric:        Behavior: Behavior normal.     ED Results and Treatments Labs (all labs ordered are listed, but only abnormal results are displayed) Labs Reviewed  URINALYSIS, ROUTINE W REFLEX MICROSCOPIC - Abnormal; Notable for the following components:      Result Value   Specific Gravity, Urine <1.005 (*)    All other components within normal limits  CBC WITH DIFFERENTIAL/PLATELET - Abnormal; Notable for the following components:   MCV 77.5 (*)    MCH 25.4 (*)    All other components within normal limits  COMPREHENSIVE METABOLIC PANEL - Abnormal; Notable for the following components:   Total Bilirubin 0.2 (*)    All other components within normal limits  PREGNANCY, URINE  LIPASE, BLOOD  EKG  EKG Interpretation  Date/Time:    Ventricular Rate:    PR Interval:    QRS Duration:   QT Interval:     QTC Calculation:   R Axis:     Text Interpretation:        Radiology CT Renal Stone Study  Result Date: 07/11/2020 CLINICAL DATA:  Left flank pain, left abdominal pain EXAM: CT ABDOMEN AND PELVIS WITHOUT CONTRAST TECHNIQUE: Multidetector CT imaging of the abdomen and pelvis was performed following the standard protocol without IV contrast. COMPARISON:  None. FINDINGS: Lower chest: The visualized lung bases are clear. The visualized heart and pericardium are unremarkable. Hepatobiliary: No focal liver abnormality is seen. No gallstones, gallbladder wall thickening, or biliary dilatation. Pancreas: Unremarkable Spleen: Unremarkable Adrenals/Urinary Tract: Adrenal glands are unremarkable. Kidneys are normal, without renal calculi, focal lesion, or hydronephrosis. Bladder is unremarkable. Stomach/Bowel: Stomach is within normal limits. Appendix appears normal. No evidence of bowel wall thickening, distention, or inflammatory changes. No free intraperitoneal gas or fluid. Vascular/Lymphatic: No significant vascular findings are present. No enlarged abdominal or pelvic lymph nodes. Reproductive: Uterus and bilateral adnexa are unremarkable. Other: Infiltrative changes within the anterior abdominal wall may relate to abdominoplasty. Rectum unremarkable. Musculoskeletal: Osseous structures are age-appropriate. No lytic or blastic bone lesions. No acute bone abnormality. IMPRESSION: No acute intra-abdominal pathology identified. No definite radiographic explanation for the patient's reported symptoms. Electronically Signed   By: Helyn Numbers MD   On: 07/11/2020 02:38    Pertinent labs & imaging results that were available during my care of the patient were reviewed by me and considered in my medical decision making (see chart for details).  Medications Ordered in ED Medications - No data to display                                                                                                                                   Procedures Procedures  (including critical care time)  Medical Decision Making / ED Course I have reviewed the nursing notes for this encounter and the patient's prior records (if available in EHR or on provided paperwork).   Dilynn Jock was evaluated in Emergency Department on 07/11/2020 for the symptoms described in the history of present illness. She was evaluated in the context of the global COVID-19 pandemic, which necessitated consideration that the patient might be at risk for infection with the SARS-CoV-2 virus that causes COVID-19. Institutional protocols and algorithms that pertain to the evaluation of patients at risk for COVID-19 are in a state of rapid change based on information released by regulatory bodies including the CDC and federal and state organizations. These policies and algorithms were followed during the patient's care in the ED.  Mid abdominal pain with tenderness to palpation Labs reassuring without leukocytosis or anemia.  No significant electrolyte derangements or renal sufficiency. No evidence of bili obstruction or pancreatitis UA without evidence of infection or hematuria UPT negative  ruling out ectopic pregnancy CT scan obtained to assess for her stones which were negative.  No other serious intra-abdominal inflammatory/infectious process or bowel obstruction noted. Not consistent with torsion.      Final Clinical Impression(s) / ED Diagnoses Final diagnoses:  Abdominal cramping   The patient appears reasonably screened and/or stabilized for discharge and I doubt any other medical condition or other [facility] requiring further screening, evaluation, or treatment in the ED at this time prior to discharge. Safe for discharge with strict return precautions.  Disposition: Discharge  Condition: Good  I have discussed the results, Dx and Tx plan with the patient/family who expressed understanding and agree(s) with the plan. Discharge  instructions discussed at length. The patient/family was given strict return precautions who verbalized understanding of the instructions. No further questions at time of discharge.    ED Discharge Orders    None       Follow Up: Primary care provider  Call  to schedule an appointment for close follow up      This chart was dictated using voice recognition software.  Despite best efforts to proofread,  errors can occur which can change the documentation meaning.   Nira Conn, MD 07/11/20 [phone]

## 2020-12-03 ENCOUNTER — Ambulatory Visit: Payer: Medicaid Other | Admitting: Obstetrics and Gynecology

## 2020-12-03 NOTE — Progress Notes (Deleted)
[facility] New Patient Evaluation and Consultation  Referring Provider: Dalbert Mayotte, PA-C PCP: Patient, No Pcp Per (Inactive) Date of Service: 12/03/2020  SUBJECTIVE Chief Complaint: No chief complaint on file.  History of Present Illness: Ziasia Wisener is a 30 y.o. Asian female seen in consultation at the request of PA Rolly Salter for evaluation of dysuria.    Review of records from [facility] significant for: Patient endorses urethral irritation- tried Azo and it did not help. May be leaking urine without warning at times.   Urinary Symptoms: {urine leakage?:24754} Leaks *** time(s) per {days/wks/mos/yrs:310907}.  Pad use: {NUMBERS 1-10:18281} {pad option:24752} per day.   She {ACTION; IS/IS QQP:61950932} bothered by her UI symptoms.  Day time voids ***.  Nocturia: *** times per night to void. Voiding dysfunction: she {empties:24755} her bladder well.  {DOES NOT does:27190::"does not"} use a catheter to empty bladder.  When urinating, she feels {urine symptoms:24756} Drinks: *** per day  UTIs: {NUMBERS 1-10:18281} UTI's in the last year.   {ACTIONS;DENIES/REPORTS:21021675::"Denies"} history of {urologic concerns:24757}  Pelvic Organ Prolapse Symptoms:                  She {denies/ admits to:24761} a feeling of a bulge the vaginal area. It has been present for {NUMBER 1-10:22536} {days/wks/mos/yrs:310907}.  She {denies/ admits to:24761} seeing a bulge.  This bulge {ACTION; IS/IS IZT:24580998} bothersome.  Bowel Symptom: Bowel movements: *** time(s) per {Time; day/week/month:13537} Stool consistency: {stool consistency:24758} Straining: {yes/no:19897}.  Splinting: {yes/no:19897}.  Incomplete evacuation: {yes/no:19897}.  She {denies/ admits to:24761} accidental bowel leakage / fecal incontinence  Occurs: *** time(s) per {Time; day/week/month:13537}  Consistency with leakage: {stool consistency:24758} Bowel regimen: {bowel regimen:24759} Last  colonoscopy: Date ***, Results ***  Sexual Function Sexually active: {yes/no:19897}.  Sexual orientation: {Sexual Orientation:[phone]} Pain with sex: {pain with sex:24762}  Pelvic Pain {denies/ admits to:24761} pelvic pain Location: *** Pain occurs: *** Prior pain treatment: *** Improved by: *** Worsened by: ***   Past Medical History:  Past Medical History:  Diagnosis Date   No pertinent past medical history      Past Surgical History:   Past Surgical History:  Procedure Laterality Date   CESAREAN SECTION  03/09/2011   Procedure: CESAREAN SECTION;  Surgeon: Purcell Nails, MD;  Location: [facility];  Service: Gynecology;  Laterality: N/A;  Primary Cesarean Section Delivery Baby  Boy @   [phone], Apgars 8/9   NO PAST SURGERIES       Past OB/GYN History: G{NUMBERS 1-10:18281} P{NUMBERS 1-10:18281} Vaginal deliveries: ***,  Forceps/ Vacuum deliveries: ***, Cesarean section: *** Menopausal: {menopausal:24763} Contraception: ***. Last pap smear was ***.  Any history of abnormal pap smears: {yes/no:19897}.   Medications: She has a current medication list which includes the following prescription(s): cephalexin, cetirizine, nexplanon, isotretinoin, omeprazole, ondansetron, spironolactone, sucralfate, and triamcinolone.   Allergies: Patient has No Known Allergies.   Social History:  Social History   Tobacco Use   Smoking status: Never   Smokeless tobacco: Never  Vaping Use   Vaping Use: Never used  Substance Use Topics   Alcohol use: No   Drug use: No    Relationship status: {relationship status:24764} She lives with ***.   She {ACTION; IS/IS NKN:39767341} employed ***. Regular exercise: {Yes/No:304960894} History of abuse: {Yes/No:304960894}  Family History:   Family History  Problem Relation Age of Onset   Asthma Brother    Cancer Maternal Grandfather      Review of Systems: ROS   OBJECTIVE Physical Exam: There were no  vitals filed for this  visit.  Physical Exam   GU / Detailed Urogynecologic Evaluation:  Pelvic Exam: Normal external female genitalia; Bartholin's and Skene's glands normal in appearance; urethral meatus normal in appearance, no urethral masses or discharge.   CST: {gen negative/positive:315881}  Reflexes: bulbocavernosis {DESC; PRESENT/NOT PRESENT:21021351}, anocutaneous {DESC; PRESENT/NOT PRESENT:21021351} ***bilaterally.  Speculum exam reveals normal vaginal mucosa {With/Without:20273} atrophy. Cervix {exam; gyn cervix:30847}. Uterus {exam; pelvic uterus:30849}. Adnexa {exam; adnexa:12223}.    s/p hysterectomy: Speculum exam reveals normal vaginal mucosa {With/Without:20273}  atrophy and normal vaginal cuff.  Adnexa {exam; adnexa:12223}.    With apex supported, anterior compartment defect was {reduced:24765}  Pelvic floor strength {Roman # I-V:19040}/V, puborectalis {Roman # I-V:19040}/V external anal sphincter {Roman # I-V:19040}/V  Pelvic floor musculature: Right levator {Tender/Non-tender:20250}, Right obturator {Tender/Non-tender:20250}, Left levator {Tender/Non-tender:20250}, Left obturator {Tender/Non-tender:20250}  POP-Q:   POP-Q                                               Aa                                               Ba                                                 C                                                Gh                                               Pb                                               tvl                                                Ap                                               Bp                                                 D     Rectal Exam:  Normal sphincter tone, {rectocele:24766} distal rectocele, enterocoele {DESC; PRESENT/NOT PRESENT:21021351}, no rectal masses, {sign of:24767} dyssynergia when  asking the patient to bear down.  Post-Void Residual (PVR) by Bladder Scan: In order to evaluate bladder emptying, we discussed  obtaining a postvoid residual and she agreed to this procedure.  Procedure: The ultrasound unit was placed on the patient's abdomen in the suprapubic region after the patient had voided. A PVR of *** ml was obtained by bladder scan.  Laboratory Results: @ENCLABS @   ***I visualized the urine specimen, noting the specimen to be {urine color:24768}  ASSESSMENT AND PLAN Ms. Labrador is a 30 y.o. with: No diagnosis found.    34, MD   Medical Decision Making:  - Reviewed/ ordered a clinical laboratory test - Reviewed/ ordered a radiologic study - Reviewed/ ordered medicine test - Decision to obtain old records - Discussion of management of or test interpretation with an external physician / other healthcare professional  - Assessment requiring independent historian - Review and summation of prior records - Independent review of image, tracing or specimen

## 2022-01-19 ENCOUNTER — Encounter (HOSPITAL_COMMUNITY): Payer: Self-pay | Admitting: Obstetrics and Gynecology

## 2022-01-19 ENCOUNTER — Inpatient Hospital Stay (HOSPITAL_COMMUNITY)
Admission: AD | Admit: 2022-01-19 | Discharge: 2022-01-19 | Disposition: A | Discharge status: Home / Self Care | Payer: Medicaid Other | Attending: Obstetrics and Gynecology | Admitting: Obstetrics and Gynecology

## 2022-01-19 DIAGNOSIS — N949 Unspecified condition associated with female genital organs and menstrual cycle: Secondary | ICD-10-CM

## 2022-01-19 DIAGNOSIS — O26891 Other specified pregnancy related conditions, first trimester: Secondary | ICD-10-CM | POA: Insufficient documentation

## 2022-01-19 DIAGNOSIS — R109 Unspecified abdominal pain: Secondary | ICD-10-CM | POA: Diagnosis not present

## 2022-01-19 DIAGNOSIS — O26851 Spotting complicating pregnancy, first trimester: Secondary | ICD-10-CM | POA: Insufficient documentation

## 2022-01-19 DIAGNOSIS — Z3A12 12 weeks gestation of pregnancy: Secondary | ICD-10-CM | POA: Diagnosis not present

## 2022-01-19 DIAGNOSIS — Z6720 Type B blood, Rh positive: Secondary | ICD-10-CM

## 2022-01-19 DIAGNOSIS — N930 Postcoital and contact bleeding: Secondary | ICD-10-CM

## 2022-01-19 LAB — URINALYSIS, ROUTINE W REFLEX MICROSCOPIC
Bilirubin Urine: NEGATIVE
Glucose, UA: NEGATIVE mg/dL
Hgb urine dipstick: NEGATIVE
Ketones, ur: NEGATIVE mg/dL
Nitrite: NEGATIVE
Protein, ur: NEGATIVE mg/dL
Specific Gravity, Urine: 1.014 (ref 1.005–1.030)
pH: 7 (ref 5.0–8.0)

## 2022-01-19 NOTE — MAU Note (Addendum)
.  Latoya Sanchez is a 31 y.o. at [redacted]w[redacted]d here in MAU reporting spotting all day yesterday. Today I have been cramping all day in lower abd and into my lower back. Spotting stopped last night. Pain is worse with getting up and walking. Pt receives care at [facility] LMP: 10/27/21 Onset of complaint: yesterday Pain score: 6 Vitals:   01/19/22 2222 01/19/22 2223  BP:  110/77  Pulse: 73   Resp: 17   Temp: 98.3 F (36.8 C)   SpO2: 100%      FHT:160 Lab orders placed from triage:  u/a

## 2022-01-19 NOTE — MAU Provider Note (Signed)
History     CSN: [id number]  Arrival date and time: 01/19/22 2200   Event Date/Time   First Provider Initiated Contact with Patient 01/19/22 2313      Chief Complaint  Patient presents with   Abdominal Pain   Vaginal Bleeding   Latoya Sanchez is a 31 y.o. [id number] at [redacted]w[redacted]d who presents today with spotting and cramping. She reports that she had intercourse yesterday and spotting occurred yesterday. Spotting has stopped now, but she continues to have cramping.   Vaginal Bleeding The patient's primary symptoms include pelvic pain and vaginal bleeding. This is a new problem. The current episode started yesterday. The problem has been unchanged. The problem affects both sides. She is pregnant. Associated symptoms include nausea and vomiting. The vaginal discharge was normal. There has been no bleeding (Had some spotting yesterday, but none today). Nothing aggravates the symptoms. She has tried nothing for the symptoms. Sexual activity: patient reports intercourse in the last 24 hours.    OB History     Gravida  6   Para  1   Term  1   Preterm      AB  3   Living  1      SAB  2   IAB  1   Ectopic      Multiple      Live Births  1           Past Medical History:  Diagnosis Date   No pertinent past medical history     Past Surgical History:  Procedure Laterality Date   CESAREAN SECTION  03/09/2011   Procedure: CESAREAN SECTION;  Surgeon: Purcell Nails, MD;  Location: [facility];  Service: Gynecology;  Laterality: N/A;  Primary Cesarean Section Delivery Baby  Boy @   [phone], Apgars 8/9    Family History  Problem Relation Age of Onset   Asthma Brother    Cancer Maternal Grandfather     Social History   Tobacco Use   Smoking status: Never   Smokeless tobacco: Never  Vaping Use   Vaping Use: Never used  Substance Use Topics   Alcohol use: No   Drug use: No    Allergies: No Known Allergies  Medications Prior to Admission  Medication Sig Dispense  Refill Last Dose   Prenatal Vit-Fe Fumarate-FA (PRENATAL MULTIVITAMIN) TABS tablet Take 1 tablet by mouth daily at 12 noon.      cephALEXin (KEFLEX) 500 MG capsule Take 1 capsule (500 mg total) by mouth 2 (two) times daily. (Patient not taking: Reported on 01/19/2022) 10 capsule 0 Not Taking   cetirizine (ZYRTEC) 10 MG tablet Take 1 tablet (10 mg total) by mouth daily. (Patient not taking: Reported on 01/19/2022) 30 tablet 0 Not Taking   etonogestrel (NEXPLANON) 68 MG IMPL implant  (Patient not taking: Reported on 01/19/2022)   Not Taking   ISOtretinoin (ACCUTANE) 40 MG capsule Take 40 mg by mouth 2 (two) times daily. (Patient not taking: Reported on 01/19/2022)   Not Taking   omeprazole (PRILOSEC) 20 MG capsule Take one capsule every morning at least 30 minutes before first dose of Carafate. (Patient not taking: Reported on 01/19/2022) 30 capsule 0 Not Taking   ondansetron (ZOFRAN ODT) 8 MG disintegrating tablet Take 1 tablet (8 mg total) by mouth every 8 (eight) hours as needed for nausea or vomiting. (Patient taking differently: Take 4 mg by mouth every 8 (eight) hours as needed for nausea or vomiting.) 10 tablet 1  spironolactone (ALDACTONE) 100 MG tablet Take 150 mg by mouth daily. (Patient not taking: Reported on 01/19/2022)   Not Taking   sucralfate (CARAFATE) 1 g tablet Take 1 tablet (1 g total) by mouth 4 (four) times daily -  with meals and at bedtime. (Patient not taking: Reported on 01/19/2022) 40 tablet 0 Not Taking   triamcinolone (NASACORT) 55 MCG/ACT AERO nasal inhaler Place 2 sprays into the nose daily. (Patient not taking: Reported on 01/19/2022) 1 Inhaler 0 Not Taking    Review of Systems  Gastrointestinal:  Positive for nausea and vomiting.  Genitourinary:  Positive for pelvic pain.  All other systems reviewed and are negative.  Physical Exam   Blood pressure 110/77, pulse 73, temperature 98.3 F (36.8 C), resp. rate 17, height 5\' 2"  (1.575 m), weight 68.9 kg, SpO2 100  %.  Physical Exam Constitutional:      Appearance: She is well-developed.  HENT:     Head: Normocephalic.  Eyes:     Pupils: Pupils are equal, round, and reactive to light.  Cardiovascular:     Rate and Rhythm: Normal rate and regular rhythm.     Heart sounds: Normal heart sounds.  Pulmonary:     Effort: Pulmonary effort is normal. No respiratory distress.     Breath sounds: Normal breath sounds.  Abdominal:     Palpations: Abdomen is soft.     Tenderness: There is no abdominal tenderness.  Genitourinary:    Vagina: No bleeding. Vaginal discharge: mucusy.    Comments: External: no lesion Vagina: small amount of white discharge     Musculoskeletal:        General: Normal range of motion.     Cervical back: Normal range of motion and neck supple.  Skin:    General: Skin is warm and dry.  Neurological:     Mental Status: She is alert and oriented to person, place, and time.  Psychiatric:        Mood and Affect: Mood normal.        Behavior: Behavior normal.      Pt informed that the ultrasound is considered a limited OB ultrasound and is not intended to be a complete ultrasound exam.  Patient also informed that the ultrasound is not being completed with the intent of assessing for fetal or placental anomalies or any pelvic abnormalities.  Explained that the purpose of today's ultrasound is to assess for  viability.  Patient acknowledges the purpose of the exam and the limitations of the study.     +FHT 160 BPM  Results for orders placed or performed during the hospital encounter of 01/19/22 (from the past 24 hour(s))  Urinalysis, Routine w reflex microscopic Urine, Clean Catch     Status: Abnormal   Collection Time: 01/19/22 10:52 PM  Result Value Ref Range   Color, Urine YELLOW YELLOW   APPearance CLOUDY (A) CLEAR   Specific Gravity, Urine 1.014 1.005 - 1.030   pH 7.0 5.0 - 8.0   Glucose, UA NEGATIVE NEGATIVE mg/dL   Hgb urine dipstick NEGATIVE NEGATIVE   Bilirubin  Urine NEGATIVE NEGATIVE   Ketones, ur NEGATIVE NEGATIVE mg/dL   Protein, ur NEGATIVE NEGATIVE mg/dL   Nitrite NEGATIVE NEGATIVE   Leukocytes,Ua SMALL (A) NEGATIVE   RBC / HPF 0-5 0 - 5 RBC/hpf   WBC, UA 0-5 0 - 5 WBC/hpf   Bacteria, UA RARE (A) NONE SEEN   Squamous Epithelial / LPF 6-10 0 - 5   Mucus PRESENT  MAU Course  Procedures  MDM   Assessment and Plan   1. PCB (post coital bleeding)   2. [redacted] weeks gestation of pregnancy   3. Round ligament pain   4. Type B blood, Rh positive    DC home in stable condition  2nd Trimester precautions  Bleeding precautions Fetal kick counts RX: none  Return to MAU as needed FU with OB as planned   Follow-up Information     [facility] Follow up.   Specialty: Obstetrics and Gynecology Why: As scheduled Contact information: [place] [phone] Myrtle DNP, CNM  01/19/22  11:30 PM

## 2022-01-19 NOTE — MAU Note (Signed)
Thressa Sheller CNM in to see pt and do bedside u/s.

## 2022-01-19 NOTE — Progress Notes (Signed)
Thressa Sheller CNM in to see pt and discuss d/c plan. Written and verbal d/c instructions given and understanding voiced.

## 2022-01-21 LAB — CULTURE, OB URINE: Culture: 10000 — AB

## 2022-12-18 IMAGING — CT CT RENAL STONE PROTOCOL
2 of 4 series · 17 of 46 positions shown, 19 images · non-contrast
Comparison: None.

CLINICAL DATA: Left flank pain, left abdominal pain

EXAM:
CT ABDOMEN AND PELVIS WITHOUT CONTRAST
TECHNIQUE: Multidetector CT imaging of the abdomen and pelvis was performed
following the standard protocol without IV contrast.

[Series 2: axial st · axial · 0.94mm/px · z∈[+696,+1106]mm · 14 of 90 slices shown, 16 images]
[im 4/90  soft-tissue]
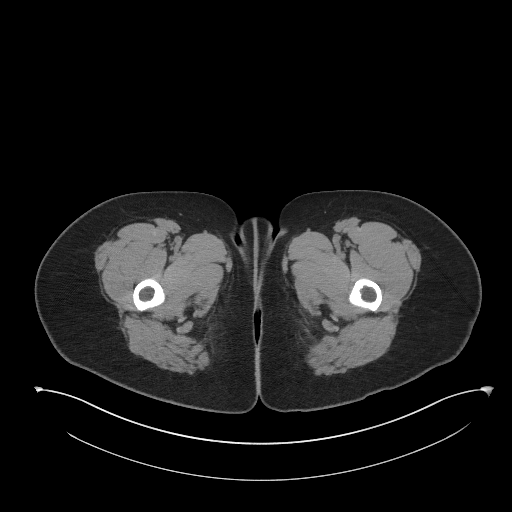
[im 4/90  bone]
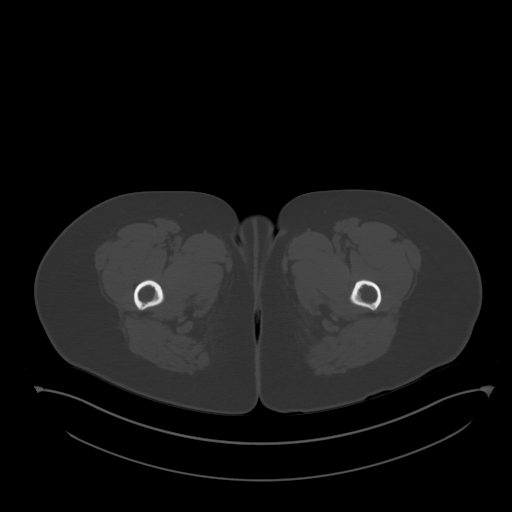
[im 11/90  soft-tissue]
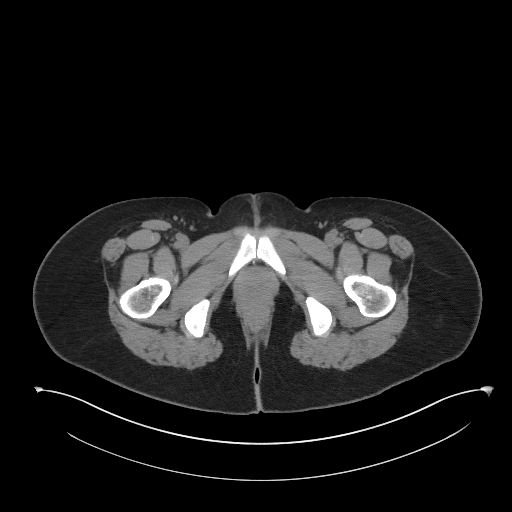
[im 18/90  soft-tissue]
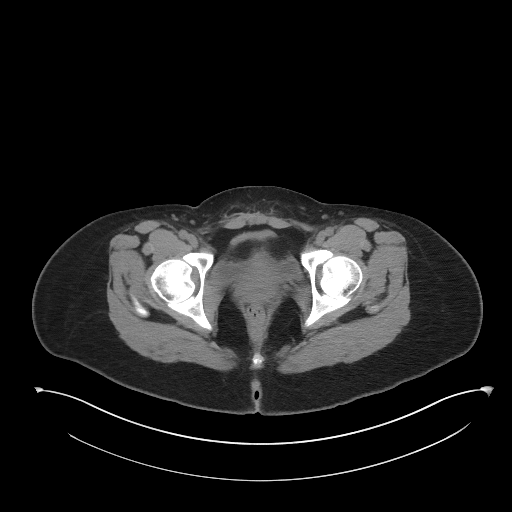
[im 24/90  soft-tissue]
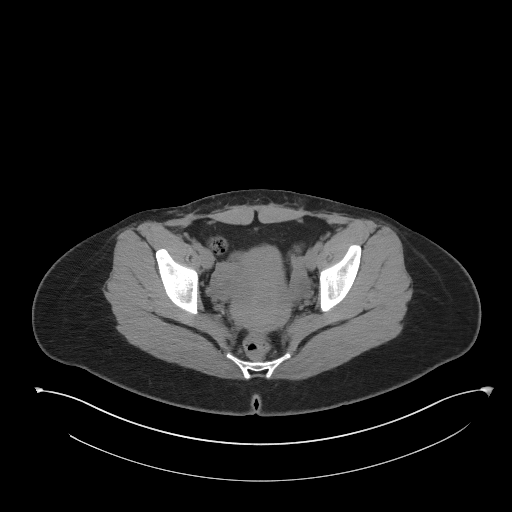
[im 31/90  soft-tissue]
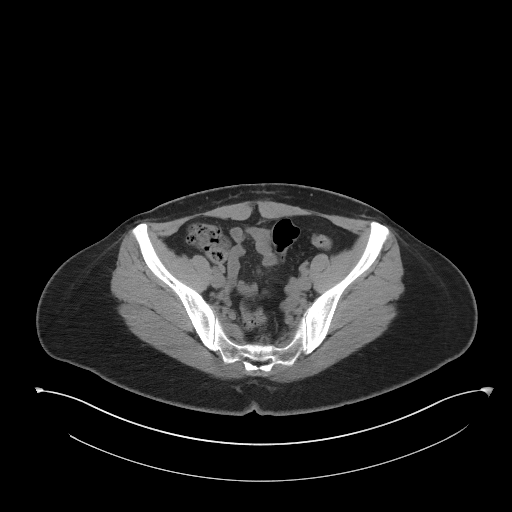
[im 35/90  soft-tissue]
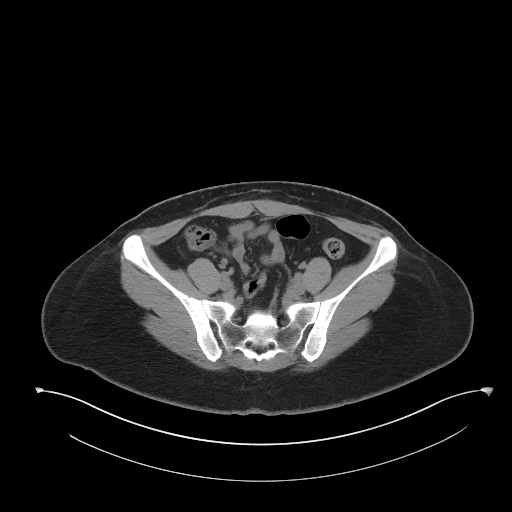
[im 42/90  soft-tissue]
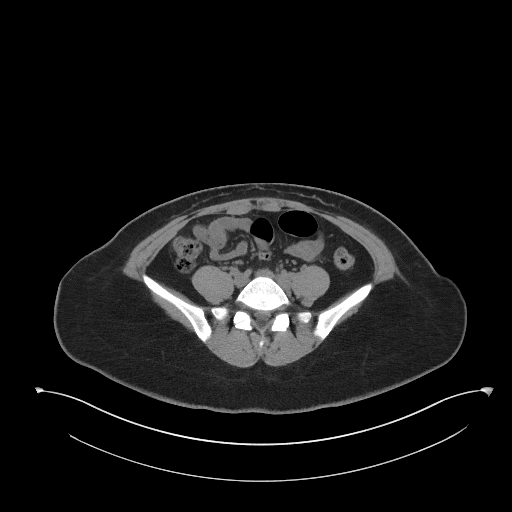
[im 48/90  soft-tissue]
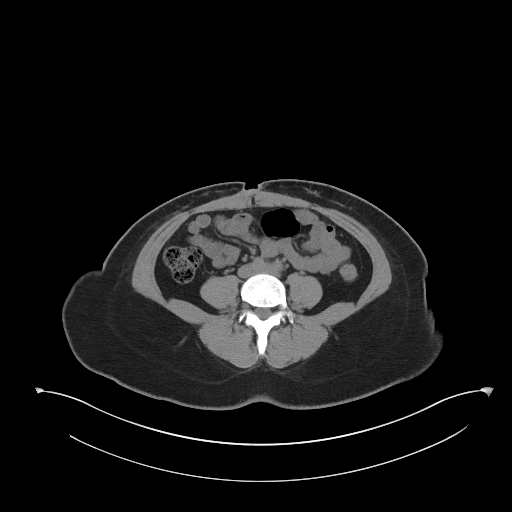
[im 55/90  soft-tissue]
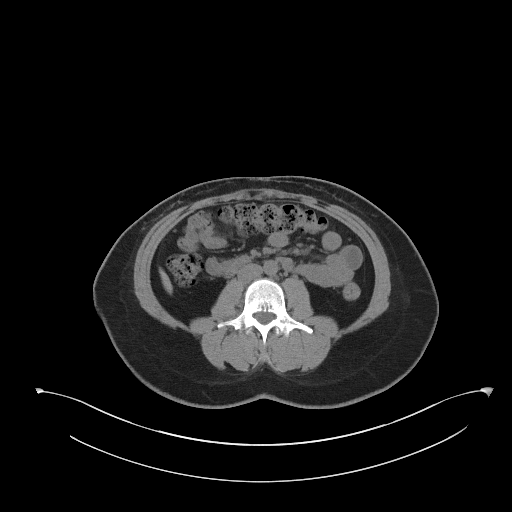
[im 55/90  bone]
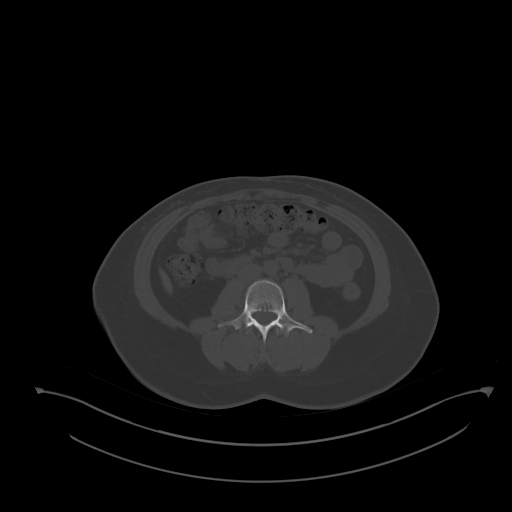
[im 59/90  soft-tissue]
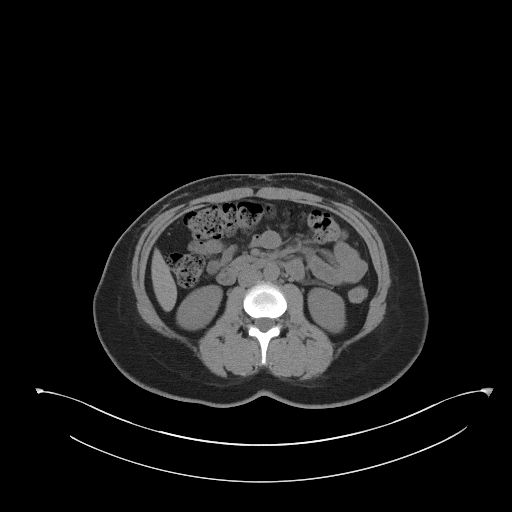
[im 66/90  soft-tissue]
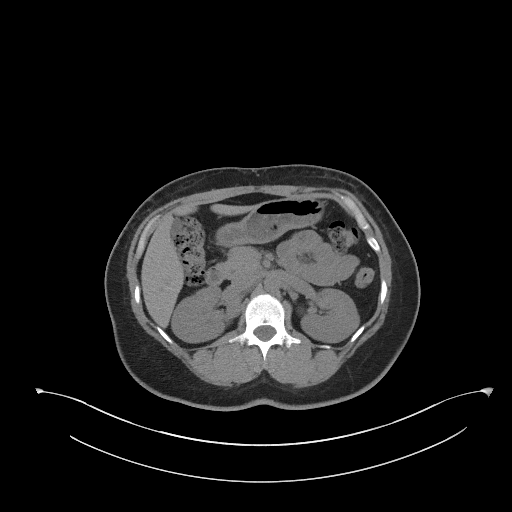
[im 72/90  soft-tissue]
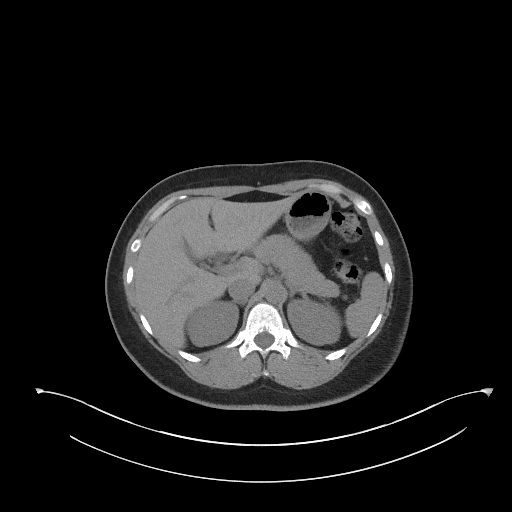
[im 79/90  soft-tissue]
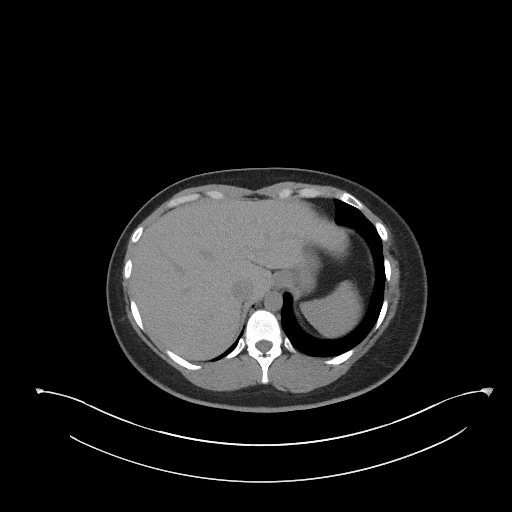
[im 86/90  soft-tissue]
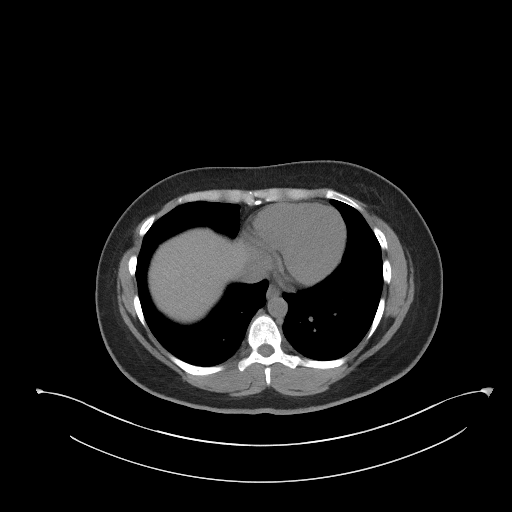

[Series 5: coronal st · coronal · 0.79mm/px · 3 of 81 slices shown]
[im 27/81  soft-tissue]
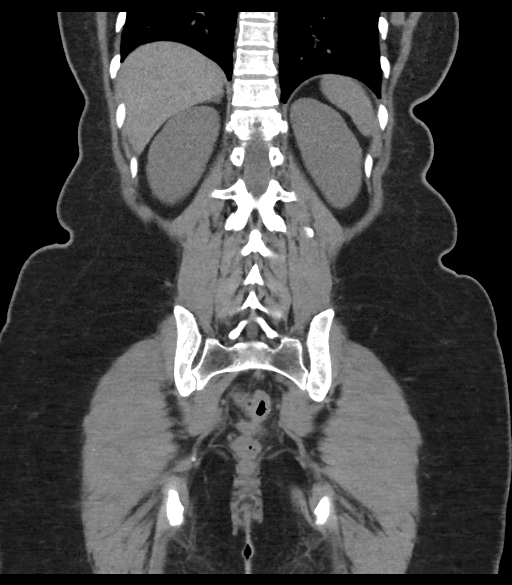
[im 36/81  soft-tissue]
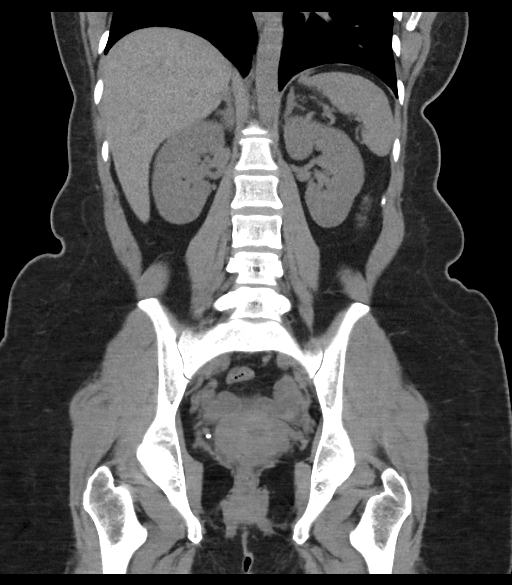
[im 45/81  soft-tissue]
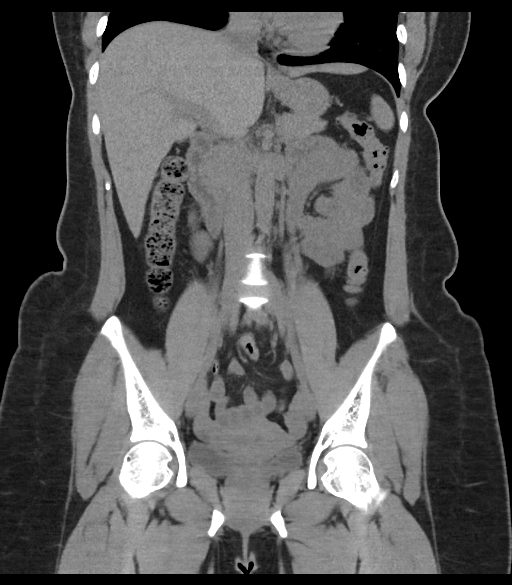

[17 of 46 positions shown; findings below may reference images not displayed]

FINDINGS: Lower chest: The visualized lung bases are clear. The visualized
heart and pericardium are unremarkable.

Hepatobiliary: No focal liver abnormality is seen. No gallstones,
gallbladder wall thickening, or biliary dilatation.

Pancreas: Unremarkable

Spleen: Unremarkable

Adrenals/Urinary Tract: Adrenal glands are unremarkable. Kidneys are
normal, without renal calculi, focal lesion, or hydronephrosis.
Bladder is unremarkable.

Stomach/Bowel: Stomach is within normal limits. Appendix appears
normal. No evidence of bowel wall thickening, distention, or
inflammatory changes. No free intraperitoneal gas or fluid.

Vascular/Lymphatic: No significant vascular findings are present. No
enlarged abdominal or pelvic lymph nodes.

Reproductive: Uterus and bilateral adnexa are unremarkable.

Other: Infiltrative changes within the anterior abdominal wall may
relate to abdominoplasty. Rectum unremarkable.

Musculoskeletal: Osseous structures are age-appropriate. No lytic or
blastic bone lesions. No acute bone abnormality.
IMPRESSION: No acute intra-abdominal pathology identified. No definite
radiographic explanation for the patient's reported symptoms.

## 2023-04-25 ENCOUNTER — Inpatient Hospital Stay (HOSPITAL_COMMUNITY)

## 2023-04-25 ENCOUNTER — Other Ambulatory Visit: Payer: Self-pay

## 2023-04-25 ENCOUNTER — Encounter (HOSPITAL_COMMUNITY): Payer: Self-pay | Admitting: Obstetrics and Gynecology

## 2023-04-25 ENCOUNTER — Inpatient Hospital Stay (HOSPITAL_COMMUNITY)
Admission: AD | Admit: 2023-04-25 | Discharge: 2023-04-26 | Disposition: A | Discharge status: Home / Self Care | Attending: Obstetrics and Gynecology | Admitting: Obstetrics and Gynecology

## 2023-04-25 DIAGNOSIS — Z3A10 10 weeks gestation of pregnancy: Secondary | ICD-10-CM

## 2023-04-25 DIAGNOSIS — R109 Unspecified abdominal pain: Secondary | ICD-10-CM | POA: Diagnosis not present

## 2023-04-25 DIAGNOSIS — O26891 Other specified pregnancy related conditions, first trimester: Secondary | ICD-10-CM | POA: Insufficient documentation

## 2023-04-25 DIAGNOSIS — O209 Hemorrhage in early pregnancy, unspecified: Secondary | ICD-10-CM | POA: Diagnosis not present

## 2023-04-25 DIAGNOSIS — Z3491 Encounter for supervision of normal pregnancy, unspecified, first trimester: Secondary | ICD-10-CM

## 2023-04-25 LAB — CBC
HCT: 35.8 % — ABNORMAL LOW (ref 36.0–46.0)
Hemoglobin: 12 g/dL (ref 12.0–15.0)
MCH: 24.5 pg — ABNORMAL LOW (ref 26.0–34.0)
MCHC: 33.5 g/dL (ref 30.0–36.0)
MCV: 73.1 fL — ABNORMAL LOW (ref 80.0–100.0)
Platelets: 291 10*3/uL (ref 150–400)
RBC: 4.9 MIL/uL (ref 3.87–5.11)
RDW: 15.6 % — ABNORMAL HIGH (ref 11.5–15.5)
WBC: 9.8 10*3/uL (ref 4.0–10.5)
nRBC: 0 % (ref 0.0–0.2)

## 2023-04-25 LAB — URINALYSIS, ROUTINE W REFLEX MICROSCOPIC
Bacteria, UA: NONE SEEN
Bilirubin Urine: NEGATIVE
Glucose, UA: NEGATIVE mg/dL
Ketones, ur: NEGATIVE mg/dL
Nitrite: NEGATIVE
Protein, ur: NEGATIVE mg/dL
Specific Gravity, Urine: 1.002 — ABNORMAL LOW (ref 1.005–1.030)
pH: 6 (ref 5.0–8.0)

## 2023-04-25 LAB — HCG, QUANTITATIVE, PREGNANCY: hCG, Beta Chain, Quant, S: 65571 m[IU]/mL — ABNORMAL HIGH (ref <5)

## 2023-04-25 NOTE — MAU Note (Signed)
.  Latoya Sanchez is a 33 y.o. at Unknown here in MAU reporting: VB and cramping since 1200. Under MFM care - called them and they told her to come in. States bleeding stopped an hour ago, but still having cramping.   LMP: 12/28 Onset of complaint: 1200 Pain score: 7 Vitals:   04/25/23 2028  BP: 122/78  Pulse: 76  Resp: 18  Temp: 98.2 F (36.8 C)  SpO2: 100%     FHT: unable to hear with doppler.   Lab orders placed from triage: UA

## 2023-04-25 NOTE — MAU Provider Note (Signed)
 S [name] is a 33 y.o. [phone] patient who presents to MAU today with complaint of VB and cramping that started at 1200 Noon today. She is currently seen by MFM and [facility] - NOB visit on 04/18/2018 with a pregnancy confirmed at [redacted]w[redacted]d by ultrasound with EDC established 11/19/2023.   Patient reports bleeding stopped ~ 1 hour ago but still cramping. Denies taking any medication.   Review of Systems  All other systems reviewed and are negative.   O BP 122/78 (BP Location: Right Arm)   Pulse 76   Temp 98.2 F (36.8 C)   Resp 18   Ht 5\' 2"  (1.575 m)   Wt 73.3 kg   LMP 02/12/2023   SpO2 100%   BMI 29.58 kg/m  Physical Exam Vitals and nursing note reviewed. Exam conducted with a chaperone present.  Constitutional:      General: She is not in acute distress.    Appearance: She is well-developed. She is not ill-appearing.  HENT:     Head: Normocephalic.  Cardiovascular:     Rate and Rhythm: Normal rate and regular rhythm.  Pulmonary:     Effort: Pulmonary effort is normal.     Breath sounds: Normal breath sounds.  Abdominal:     Palpations: Abdomen is soft.  Skin:    General: Skin is warm and dry.  Neurological:     Mental Status: She is alert and oriented to person, place, and time.  Psychiatric:        Mood and Affect: Mood normal.        Behavior: Behavior normal.    MDM: HIGH  CBC: unremarkable U/A: unremarkable ABO B Positive Hcg OB Ultrasound  I have reviewed the patient chart and performed the physical exam . I have ordered & interpreted the lab results and reviewed and interpreted the sono images Medications ordered as stated below.  A/P as described below.  Counseling and education provided and patient agreeable  with plan as described below. Verbalized understanding.     Orders Placed This Encounter  Procedures   US OB Comp Less 14 Wks    Standing Status:   Standing    Number of Occurrences:   1    Symptom/Reason for Exam:   Vaginal bleeding in  pregnancy, first trimester [[id number]]    Release to patient:   Immediate   CBC    Standing Status:   Standing    Number of Occurrences:   1   hCG, quantitative, pregnancy    Standing Status:   Standing    Number of Occurrences:   1   Urinalysis, Routine w reflex microscopic -Urine, Clean Catch    Standing Status:   Standing    Number of Occurrences:   1    Specimen Source:   Urine, Clean Catch [[id number]]    CBC U/A ABO B Positive Hcg OB Ultrasound   Results for orders placed or performed during the hospital encounter of 04/25/23 (from the past 24 hours)  CBC     Status: Abnormal   Collection Time: 04/25/23  8:25 PM  Result Value Ref Range   WBC 9.8 4.0 - 10.5 K/uL   RBC 4.90 3.87 - 5.11 MIL/uL   Hemoglobin 12.0 12.0 - 15.0 g/dL   HCT 78.2 (L) 95.6 - 21.3 %   MCV 73.1 (L) 80.0 - 100.0 fL   MCH 24.5 (L) 26.0 - 34.0 pg   MCHC 33.5 30.0 - 36.0 g/dL   RDW 08.6 (H)  11.5 - 15.5 %   Platelets 291 150 - 400 K/uL   nRBC 0.0 0.0 - 0.2 %  hCG, quantitative, pregnancy     Status: Abnormal   Collection Time: 04/25/23  8:25 PM  Result Value Ref Range   hCG, Beta Chain, Quant, S 65,571 (H) <5 mIU/mL  Urinalysis, Routine w reflex microscopic -Urine, Clean Catch     Status: Abnormal   Collection Time: 04/25/23  8:30 PM  Result Value Ref Range   Color, Urine STRAW (A) YELLOW   APPearance CLEAR CLEAR   Specific Gravity, Urine 1.002 (L) 1.005 - 1.030   pH 6.0 5.0 - 8.0   Glucose, UA NEGATIVE NEGATIVE mg/dL   Hgb urine dipstick MODERATE (A) NEGATIVE   Bilirubin Urine NEGATIVE NEGATIVE   Ketones, ur NEGATIVE NEGATIVE mg/dL   Protein, ur NEGATIVE NEGATIVE mg/dL   Nitrite NEGATIVE NEGATIVE   Leukocytes,Ua TRACE (A) NEGATIVE   RBC / HPF 0-5 0 - 5 RBC/hpf   WBC, UA 0-5 0 - 5 WBC/hpf   Bacteria, UA NONE SEEN NONE SEEN   Squamous Epithelial / HPF 0-5 0 - 5 /HPF   Mucus PRESENT      Narrative & Impression  CLINICAL DATA:  Vaginal bleeding pregnancy   EXAM: OBSTETRIC <14 WK ULTRASOUND    TECHNIQUE: Transabdominal ultrasound was performed for evaluation of the gestation as well as the maternal uterus and adnexal regions.   COMPARISON:  None Available.   FINDINGS: Intrauterine gestational sac: Single intrauterine gestational sac   Yolk sac:  Visualized   Embryo:  Visualized   Cardiac Activity: Visualized   Heart Rate: 154 bpm   CRL:   40.1 mm   10 w 6 d                  [place] EDC: 11/15/2023   Subchorionic hemorrhage:  None visualized.   Maternal uterus/adnexae: Right ovary not well seen. Left ovary measures 2.7 x 2.6 x 2.6 cm and contains corpus luteum.   IMPRESSION: Single viable intrauterine pregnancy as above. No acute abnormality is seen     Electronically Signed   By: Latoya Sanchez M.D.   On: 04/25/2023 23:10    ASSESSMENT Medical screening exam complete  1. Normal intrauterine pregnancy on prenatal ultrasound in first trimester (Primary)  2. Vaginal bleeding affecting early pregnancy  3. [redacted] weeks gestation of pregnancy    PLAN F/U as scheduled with OB Provider  Discharge from MAU in stable condition Patient given the option of transfer to [facility] for further evaluation or seek care in outpatient facility of choice  List of options for follow-up given  Warning signs for worsening condition that would warrant emergency follow-up discussed Patient may return to MAU as needed  See AVS for verbal and written instructions and education provided to the patient at discharge. Verbalized understanding and is in agreement with the plan as described above   Latoya Cater, NP 04/25/2023 9:13 PM

## 2023-04-26 DIAGNOSIS — Z3A10 10 weeks gestation of pregnancy: Secondary | ICD-10-CM

## 2023-04-26 DIAGNOSIS — O209 Hemorrhage in early pregnancy, unspecified: Secondary | ICD-10-CM

## 2023-05-29 ENCOUNTER — Encounter (HOSPITAL_BASED_OUTPATIENT_CLINIC_OR_DEPARTMENT_OTHER): Payer: Self-pay | Admitting: Emergency Medicine

## 2023-05-29 ENCOUNTER — Other Ambulatory Visit: Payer: Self-pay

## 2023-05-29 ENCOUNTER — Emergency Department (HOSPITAL_BASED_OUTPATIENT_CLINIC_OR_DEPARTMENT_OTHER)
Admission: EM | Admit: 2023-05-29 | Discharge: 2023-05-29 | Disposition: A | Discharge status: Home / Self Care | Attending: Emergency Medicine | Admitting: Emergency Medicine

## 2023-05-29 DIAGNOSIS — R21 Rash and other nonspecific skin eruption: Secondary | ICD-10-CM | POA: Insufficient documentation

## 2023-05-29 DIAGNOSIS — Z3A15 15 weeks gestation of pregnancy: Secondary | ICD-10-CM | POA: Insufficient documentation

## 2023-05-29 DIAGNOSIS — Z3490 Encounter for supervision of normal pregnancy, unspecified, unspecified trimester: Secondary | ICD-10-CM

## 2023-05-29 DIAGNOSIS — L299 Pruritus, unspecified: Secondary | ICD-10-CM | POA: Diagnosis not present

## 2023-05-29 DIAGNOSIS — O26892 Other specified pregnancy related conditions, second trimester: Secondary | ICD-10-CM | POA: Insufficient documentation

## 2023-05-29 LAB — URINALYSIS, ROUTINE W REFLEX MICROSCOPIC
Bilirubin Urine: NEGATIVE
Glucose, UA: NEGATIVE mg/dL
Hgb urine dipstick: NEGATIVE
Ketones, ur: NEGATIVE mg/dL
Leukocytes,Ua: NEGATIVE
Nitrite: NEGATIVE
Protein, ur: NEGATIVE mg/dL
Specific Gravity, Urine: 1.015 (ref 1.005–1.030)
pH: 6 (ref 5.0–8.0)

## 2023-05-29 LAB — COMPREHENSIVE METABOLIC PANEL WITH GFR
ALT: 13 U/L (ref 0–44)
AST: 17 U/L (ref 15–41)
Albumin: 3 g/dL — ABNORMAL LOW (ref 3.5–5.0)
Alkaline Phosphatase: 48 U/L (ref 38–126)
Anion gap: 8 (ref 5–15)
BUN: 14 mg/dL (ref 6–20)
CO2: 20 mmol/L — ABNORMAL LOW (ref 22–32)
Calcium: 8.9 mg/dL (ref 8.9–10.3)
Chloride: 107 mmol/L (ref 98–111)
Creatinine, Ser: 0.53 mg/dL (ref 0.44–1.00)
GFR, Estimated: >60 mL/min (ref >60)
Glucose, Bld: 94 mg/dL (ref 70–99)
Potassium: 3.5 mmol/L (ref 3.5–5.1)
Sodium: 135 mmol/L (ref 135–145)
Total Bilirubin: 0.5 mg/dL (ref 0.0–1.2)
Total Protein: 7 g/dL (ref 6.5–8.1)

## 2023-05-29 LAB — CBC WITH DIFFERENTIAL/PLATELET
Abs Immature Granulocytes: 0.04 10*3/uL (ref 0.00–0.07)
Basophils Absolute: 0 10*3/uL (ref 0.0–0.1)
Basophils Relative: 0 %
Eosinophils Absolute: 0.2 10*3/uL (ref 0.0–0.5)
Eosinophils Relative: 3 %
HCT: 34 % — ABNORMAL LOW (ref 36.0–46.0)
Hemoglobin: 11.5 g/dL — ABNORMAL LOW (ref 12.0–15.0)
Immature Granulocytes: 0 %
Lymphocytes Relative: 25 %
Lymphs Abs: 2.3 10*3/uL (ref 0.7–4.0)
MCH: 24.7 pg — ABNORMAL LOW (ref 26.0–34.0)
MCHC: 33.8 g/dL (ref 30.0–36.0)
MCV: 73 fL — ABNORMAL LOW (ref 80.0–100.0)
Monocytes Absolute: 0.8 10*3/uL (ref 0.1–1.0)
Monocytes Relative: 8 %
Neutro Abs: 5.9 10*3/uL (ref 1.7–7.7)
Neutrophils Relative %: 64 %
Platelets: 294 10*3/uL (ref 150–400)
RBC: 4.66 MIL/uL (ref 3.87–5.11)
RDW: 15.1 % (ref 11.5–15.5)
WBC: 9.2 10*3/uL (ref 4.0–10.5)
nRBC: 0 % (ref 0.0–0.2)

## 2023-05-29 MED ORDER — DIPHENHYDRAMINE HCL 25 MG PO CAPS
50.0000 mg | ORAL_CAPSULE | Freq: Once | ORAL | Status: AC
  Administered 2023-05-29: 50 mg via ORAL
  Filled 2023-05-29: qty 2

## 2023-05-29 MED ORDER — CETIRIZINE HCL 10 MG PO TABS: 10.0000 mg | ORAL_TABLET | Freq: Every day | ORAL | 0 refills | Status: AC

## 2023-05-29 NOTE — ED Provider Notes (Signed)
 [place] [facility] Provider Note  CSN: [id number] Arrival date & time: 05/29/23 0301  Chief Complaint(s) Rash in Pregnancy  HPI Latoya Sanchez is a 33 y.o. female with past medical history as below, significant for G7 P1-0-3-1, anemia, C-section who presents to the ED with complaint of rash  Patient reports itchy rash over the past 2 weeks.  Began on her left arm, progressed to cover her torso and down her legs.  No swelling to her face, no rash involvement to mucous membranes or to her face or hands.  No nausea or vomiting, no difficulty speaking, swallowing.  Spouse is not experiencing similar rash.  No recent travel, no recent insect or bug bites.  No recent medication or diet changes.  No known allergens.  Rash did improve with Benadryl.  Uncomplicated pregnancy until this point, follows with [facility], no pelvic pain, no vaginal bleeding or contractions.  Past Medical History Past Medical History:  Diagnosis Date   No pertinent past medical history    Patient Active Problem List   Diagnosis Date Noted   Anemia 03/12/2011   Status post primary low transverse cesarean section 03/12/2011   Home Medication(s) Prior to Admission medications   Medication Sig Start Date End Date Taking? Authorizing Provider  cetirizine (ZYRTEC) 10 MG tablet Take 1 tablet (10 mg total) by mouth daily. Patient not taking: Reported on 01/19/2022 05/20/19   Burky, Natalie B, NP  ondansetron (ZOFRAN ODT) 8 MG disintegrating tablet Take 1 tablet (8 mg total) by mouth every 8 (eight) hours as needed for nausea or vomiting. Patient taking differently: Take 4 mg by mouth every 8 (eight) hours as needed for nausea or vomiting. 08/22/17   Molpus, John, MD  Prenatal Vit-Fe Fumarate-FA (PRENATAL MULTIVITAMIN) TABS tablet Take 1 tablet by mouth daily at 12 noon.    [provider]  triamcinolone (NASACORT) 55 MCG/ACT AERO nasal inhaler Place 2 sprays into the nose  daily. Patient not taking: Reported on 01/19/2022 05/20/19   Bynum Cassis, NP                                                                                                                                    Past Surgical History Past Surgical History:  Procedure Laterality Date   CESAREAN SECTION  03/09/2011   Procedure: CESAREAN SECTION;  Surgeon: Madelene Schanz, MD;  Location: [facility];  Service: Gynecology;  Laterality: N/A;  Primary Cesarean Section Delivery Baby  Boy @   [phone], Apgars 8/9   Family History Family History  Problem Relation Age of Onset   Asthma Brother    Cancer Maternal Grandfather     Social History Social History   Tobacco Use   Smoking status: Never   Smokeless tobacco: Never  Vaping Use   Vaping status: Never Used  Substance Use Topics   Alcohol use: No   Drug use: No  Allergies Patient has no known allergies.  Review of Systems A thorough review of systems was obtained and all systems are negative except as noted in the HPI and PMH.   Physical Exam Vital Signs  I have reviewed the triage vital signs BP 113/78 (BP Location: Left Arm)   Pulse 72   Temp 98.1 F (36.7 C) (Oral)   Resp 20   Ht 5\' 2"  (1.575 m)   Wt 73 kg   LMP 02/12/2023   SpO2 99%   BMI 29.45 kg/m  Physical Exam Vitals and nursing note reviewed.  Constitutional:      General: She is not in acute distress.    Appearance: Normal appearance. She is well-developed. She is not ill-appearing.  HENT:     Head: Normocephalic and atraumatic.     Right Ear: External ear normal.     Left Ear: External ear normal.     Nose: Nose normal.     Mouth/Throat:     Mouth: Mucous membranes are moist.  Eyes:     General: No scleral icterus.       Right eye: No discharge.        Left eye: No discharge.  Cardiovascular:     Rate and Rhythm: Normal rate.  Pulmonary:     Effort: Pulmonary effort is normal. No respiratory distress.     Breath sounds: No stridor.  Abdominal:      General: Abdomen is flat. There is no distension.     Tenderness: There is no guarding.  Musculoskeletal:        General: No deformity.     Cervical back: No rigidity.  Skin:    General: Skin is warm and dry.     Coloration: Skin is not cyanotic, jaundiced or pale.     Findings: Rash present. Rash is urticarial.     Comments: Rash noted to torso and extremities.  Rash does not include face or hands.  No rash mucous membranes.  Neurological:     Mental Status: She is alert and oriented to person, place, and time.     GCS: GCS eye subscore is 4. GCS verbal subscore is 5. GCS motor subscore is 6.  Psychiatric:        Speech: Speech normal.        Behavior: Behavior normal. Behavior is cooperative.     ED Results and Treatments Labs (all labs ordered are listed, but only abnormal results are displayed) Labs Reviewed  CBC WITH DIFFERENTIAL/PLATELET - Abnormal; Notable for the following components:      Result Value   Hemoglobin 11.5 (*)    HCT 34.0 (*)    MCV 73.0 (*)    MCH 24.7 (*)    All other components within normal limits  COMPREHENSIVE METABOLIC PANEL WITH GFR - Abnormal; Notable for the following components:   CO2 20 (*)    Albumin 3.0 (*)    All other components within normal limits  URINALYSIS, ROUTINE W REFLEX MICROSCOPIC  Radiology No results found.  Pertinent labs & imaging results that were available during my care of the patient were reviewed by me and considered in my medical decision making (see MDM for details).  Medications Ordered in ED Medications  diphenhydrAMINE (BENADRYL) capsule 50 mg (has no administration in time range)                                                                                                                                     Procedures Procedures  (including critical care time)  Medical Decision  Making / ED Course    Medical Decision Making:    Latoya Sanchez is a 33 y.o. female with past medical history as below, significant for G7 P1-0-3-1, anemia, C-section who presents to the ED with complaint of rash. The complaint involves an extensive differential diagnosis and also carries with it a high risk of complications and morbidity.  Serious etiology was considered. Ddx includes but is not limited to: DDx for rash is broad and includes common causes of atopic dermatitis, contact dermatitis, drug eruption, erythema multiforme, fifth disease, psoriasis, insect bites, eczema, pityriasis rosea, roseola, scabies, tinea corporis, urticaria, varicella, viral exanthem. Uncommon causes of rash include bullous pemphigoid, dermatitis herpetiformis, HIV acute exanthem, Kawasaki disease, lupus, Lyme disease, meningococcemia, mycosis fungoides, RMSF, scarlet fever, secondary syphilis, SSSS, SJS, and toxic shock syndrome.   Complete initial physical exam performed, notably the patient was in no distress, sitting comfortably.    Reviewed and confirmed nursing documentation for past medical history, family history, social history.  Vital signs reviewed.       Brief summary: 33 year old patient currently pregnant here with rash for around 2 weeks.  Rash appears urticarial, does not include mucous membranes or face.  No angioedema, no drooling trismus.  Did have improvement with Benadryl.    Fetal heart rate 140, she has no pelvic pain or pelvic related complaints  Unclear etiology of her rash, possible irritant dermatitis.  Recommend antihistamine, follow-up with OB/GYN and allergy clinic.  Patient presents with the above nonspecific rash of uncertain etiology. VSS. The patient is in no distress and there is no mucosal or oral involvement. Does not appear at this time to be erythema multiforme, bullous, SJS, TEN, shingles. No evidence at this time to suggest RMSF or endocarditis or Lyme disease. Patient  looks well, nontoxic and is tolerating oral intake, no neurologic signs or symptoms, no headache or photophobia or neck pain. Question viral exanthema, afebrile. Patient and family are clear on uncertain etiology of rash, and is appropriate for initial o/p tx; discussed importance of f/u and pt agrees. Patient and/or family are clear on signs and symptoms to return to the ED, and patient is safe for discharge.         Additional history obtained: -Additional history obtained from spouse -External records from outside source obtained and reviewed including: Chart review including previous notes, labs, imaging, consultation  notes including  OB/GYN documentation, home medications, prior labs   Lab Tests: -I ordered, reviewed, and interpreted labs.   The pertinent results include:   Labs Reviewed  CBC WITH DIFFERENTIAL/PLATELET - Abnormal; Notable for the following components:      Result Value   Hemoglobin 11.5 (*)    HCT 34.0 (*)    MCV 73.0 (*)    MCH 24.7 (*)    All other components within normal limits  COMPREHENSIVE METABOLIC PANEL WITH GFR - Abnormal; Notable for the following components:   CO2 20 (*)    Albumin 3.0 (*)    All other components within normal limits  URINALYSIS, ROUTINE W REFLEX MICROSCOPIC    Notable for labs stable  EKG   EKG Interpretation Date/Time:    Ventricular Rate:    PR Interval:    QRS Duration:    QT Interval:    QTC Calculation:   R Axis:      Text Interpretation:           Imaging Studies ordered: na   Medicines ordered and prescription drug management: Meds ordered this encounter  Medications   diphenhydrAMINE (BENADRYL) capsule 50 mg    -I have reviewed the patients home medicines and have made adjustments as needed   Consultations Obtained: na   Cardiac Monitoring: Continuous pulse oximetry interpreted by myself, 100% on RA.    Social Determinants of Health:  Diagnosis or treatment significantly limited by  social determinants of health: pregnant   Reevaluation: After the interventions noted above, I reevaluated the patient and found that they have improved  Co morbidities that complicate the patient evaluation  Past Medical History:  Diagnosis Date   No pertinent past medical history       Dispostion: Disposition decision including need for hospitalization was considered, and patient discharged from emergency department.    Final Clinical Impression(s) / ED Diagnoses Final diagnoses:  Rash  Pregnancy, unspecified gestational age        Teddi Favors, DO 05/29/23 [phone]

## 2023-05-29 NOTE — ED Triage Notes (Addendum)
 Pt is [redacted]w[redacted]d pregnant. She noticed a generalized rash over her torso, and legs. She describes it as "very itchy". Reports 1 episode of emesis "a couple days ago" but states she felt better afterward. States she is still feeling flutters.  G4P2 with OB care at [facility]. Pt states she did call the after hours number who instructed her to come to ED for eval. No other sx at this time. No changes to any products used at home.

## 2023-05-29 NOTE — Discharge Instructions (Addendum)
 Recommend you follow-up with OB/GYN next week and also with allergy clinic  Recommend taking antihistamine daily, also use unscented soaps and lotions, unscented detergents, wear loosefitting clothing, avoid hot showers and hot tubs.  If you developing worsening symptoms, rash spreads to your face, mouth, lips or eyes, you have swelling to your face, difficulty swallowing or speaking or breathing or any other worsening/ worrisome symptoms please return to the ER for evaluation

## 2023-05-29 NOTE — ED Triage Notes (Signed)
FHT: 140 bpm 

## 2023-05-29 NOTE — ED Notes (Signed)
 Pt given cup for urine sample, states she just voided.
